# Patient Record
Sex: Male | Born: 1990
Health system: Southern US, Community
[De-identification: ages and names within clinical notes are randomized; demographics above are authoritative.]

## PROBLEM LIST (undated history)

## (undated) DIAGNOSIS — G35 Multiple sclerosis: Secondary | ICD-10-CM

## (undated) DIAGNOSIS — F32A Depression, unspecified: Secondary | ICD-10-CM

## (undated) DIAGNOSIS — R51 Headache: Secondary | ICD-10-CM

## (undated) DIAGNOSIS — F329 Major depressive disorder, single episode, unspecified: Secondary | ICD-10-CM

## (undated) DIAGNOSIS — R519 Headache, unspecified: Secondary | ICD-10-CM

## (undated) DIAGNOSIS — F419 Anxiety disorder, unspecified: Secondary | ICD-10-CM

## (undated) HISTORY — DX: Major depressive disorder, single episode, unspecified: F32.9

## (undated) HISTORY — DX: Headache: R51

## (undated) HISTORY — DX: Headache, unspecified: R51.9

## (undated) HISTORY — PX: TOTAL HIP ARTHROPLASTY: SHX124

## (undated) HISTORY — DX: Depression, unspecified: F32.A

## (undated) HISTORY — DX: Anxiety disorder, unspecified: F41.9

---

## 2011-01-30 ENCOUNTER — Ambulatory Visit: Payer: Self-pay | Admitting: Gastroenterology

## 2011-03-18 ENCOUNTER — Ambulatory Visit (INDEPENDENT_AMBULATORY_CARE_PROVIDER_SITE_OTHER): Payer: BC Managed Care – PPO | Admitting: Gastroenterology

## 2011-03-18 ENCOUNTER — Encounter: Payer: Self-pay | Admitting: Gastroenterology

## 2011-03-18 VITALS — BP 128/62 | HR 88 | Ht 69.0 in | Wt 250.0 lb

## 2011-03-18 DIAGNOSIS — K921 Melena: Secondary | ICD-10-CM

## 2011-03-18 DIAGNOSIS — R1032 Left lower quadrant pain: Secondary | ICD-10-CM

## 2011-03-18 MED ORDER — PEG-KCL-NACL-NASULF-NA ASC-C 100 G PO SOLR: 1.0000 | Freq: Once | ORAL | Status: DC

## 2011-03-18 NOTE — Patient Instructions (Signed)
You have been scheduled for a Colonoscopy. Separate instructions given. Your prep kit has been sent to your pharmacy.  cc: Rudi Heap, MD

## 2011-03-18 NOTE — Progress Notes (Signed)
History of Present Illness: This is a 20 year old male here today with his wife for further evaluation of persistent rectal bleeding. He states for the past 2 years he is generally has small amount of rectal bleeding a daily basis. He states he has a daily bowel movement and then a few hours later he feels the urge to have another bowel movement and notes small amounts of blood at that time. He occasionally has mild anal and rectal burning and itching. He notes intermittent mild left lower quadrant pain seemed to improve with bowel movements. He denies constipation, diarrhea, change in stool caliber, change in bowel habits, weight loss, nausea, vomiting, reflux symptoms. Both he his wife feels that his symptoms tend to be worse when he is under physical and/or emotional stress. Hemoccult cards were obtained on one occasion in May and were negative. CBC and a chemistry panel from May were normal.  Past Medical History  Diagnosis Date  . Hemorrhoids   . Generalized headaches    Past Surgical History  Procedure Date  . Total hip arthroplasty     Left     reports that he has never smoked. He uses smokeless tobacco. He reports that he does not drink alcohol or use illicit drugs. family history includes Coronary artery disease in his maternal grandfather and maternal grandmother.  There is no history of Colon cancer. No Known Allergies    Outpatient Encounter Prescriptions as of 03/18/2011  Medication Sig Dispense Refill  . peg 3350 powder (MOVIPREP) 100 G SOLR Take 1 kit (100 g total) by mouth once.  1 kit  0    Review of Systems: Pertinent positive and negative review of systems were noted in the above HPI section. All other review of systems were otherwise negative.  Physical Exam: General: Well developed , well nourished, no acute distress Head: Normocephalic and atraumatic Eyes:  sclerae anicteric, EOMI Ears: Normal auditory acuity Mouth: No deformity or lesions Neck: Supple, no masses or  thyromegaly Lungs: Clear throughout to auscultation Heart: Regular rate and rhythm; no murmurs, rubs or bruits Abdomen: Soft, non tender and non distended. No masses, hepatosplenomegaly or hernias noted. Normal Bowel sounds Rectal: No internal or external lesions, no tenderness, Hemoccult negative soft brown stool in the vault.  Musculoskeletal: Symmetrical with no gross deformities  Skin: No lesions on visible extremities Pulses:  Normal pulses noted Extremities: No clubbing, cyanosis, edema or deformities noted Neurological: Alert oriented x 4, grossly nonfocal Cervical Nodes:  No significant cervical adenopathy Inguinal Nodes: No significant inguinal adenopathy Psychological:  Alert and cooperative. Normal mood and affect  Assessment and Recommendations:  1. Persistent hematochezia with occasional left lower quadrant abdominal pain. Rule out hemorrhoidal bleeding, proctitis and other disorders. Interestingly his Hemoccults were negative today and were negative in May. Begin over-the-counter Preparation H suppositories on a daily basis until the colonoscopy is completed. The risks, benefits, and alternatives to colonoscopy with possible biopsy and possible polypectomy were discussed with the patient and they consent to proceed.

## 2011-03-19 ENCOUNTER — Encounter: Payer: Self-pay | Admitting: Gastroenterology

## 2011-03-21 ENCOUNTER — Encounter: Payer: Self-pay | Admitting: Gastroenterology

## 2011-03-21 ENCOUNTER — Telehealth: Payer: Self-pay | Admitting: Gastroenterology

## 2011-03-21 NOTE — Telephone Encounter (Signed)
Yes. Please charge him. We have alternative prep that are less expensive. Sheri or Marchelle Folks can provide.

## 2011-03-21 NOTE — Telephone Encounter (Signed)
Error

## 2011-03-22 ENCOUNTER — Other Ambulatory Visit: Payer: BC Managed Care – PPO | Admitting: Gastroenterology

## 2011-04-04 ENCOUNTER — Ambulatory Visit (AMBULATORY_SURGERY_CENTER): Payer: BC Managed Care – PPO | Admitting: Gastroenterology

## 2011-04-04 ENCOUNTER — Encounter: Payer: Self-pay | Admitting: Gastroenterology

## 2011-04-04 DIAGNOSIS — K921 Melena: Secondary | ICD-10-CM

## 2011-04-04 DIAGNOSIS — K625 Hemorrhage of anus and rectum: Secondary | ICD-10-CM

## 2011-04-04 DIAGNOSIS — R1032 Left lower quadrant pain: Secondary | ICD-10-CM

## 2011-04-04 MED ORDER — SODIUM CHLORIDE 0.9 % IV SOLN: 500.0000 mL | INTRAVENOUS | Status: DC

## 2011-04-04 MED ORDER — MESALAMINE 1000 MG RE SUPP: 1000.0000 mg | Freq: Every day | RECTAL | Status: DC

## 2011-04-04 NOTE — Patient Instructions (Signed)
FOLLOW DISCHARGE INSTRUCTIONS (BLUE & GREEN SHEETS)   [facility] SUPPOSIT0RY 1000MG  PER RECTUM DAILY SENT IN TO YOUR PHARMACY FOR YOU TO PICK UP.   CALL DR. STARK'S OFFICE TO MAKE APPOINTMENT FOR 4-6 WEEKS

## 2011-04-05 ENCOUNTER — Telehealth: Payer: Self-pay | Admitting: *Deleted

## 2011-04-05 NOTE — Telephone Encounter (Signed)
No identifier, no message left. Chart given to N. Hill for further follow up./TE

## 2011-04-15 ENCOUNTER — Encounter: Payer: Self-pay | Admitting: Gastroenterology

## 2013-04-19 ENCOUNTER — Telehealth: Payer: Self-pay | Admitting: Family Medicine

## 2013-04-19 NOTE — Telephone Encounter (Signed)
Great toe redness, swelling, and pain for several months.   He has been trying to work on it at home. Appt scheduled for tomorrow. Patient aware.

## 2013-04-20 ENCOUNTER — Encounter: Payer: Self-pay | Admitting: Family Medicine

## 2013-04-20 ENCOUNTER — Ambulatory Visit (INDEPENDENT_AMBULATORY_CARE_PROVIDER_SITE_OTHER): Payer: BC Managed Care – PPO | Admitting: Family Medicine

## 2013-04-20 VITALS — BP 109/70 | HR 58 | Temp 97.2°F | Ht 69.0 in | Wt 234.0 lb

## 2013-04-20 DIAGNOSIS — L600 Ingrowing nail: Secondary | ICD-10-CM

## 2013-04-20 MED ORDER — CEPHALEXIN 500 MG PO CAPS: 500.0000 mg | ORAL_CAPSULE | Freq: Three times a day (TID) | ORAL | Status: DC

## 2013-04-20 NOTE — Progress Notes (Signed)
  Subjective:    Patient ID: Isaac Scott, male    DOB: 11-15-1990, 22 y.o.   MRN: [id number]  HPI Patient complains of left ingrown toenail for a couple of months. He thought it was getting better for a while and then it has gotten worse again. The patient's wife is with him today.    Review of Systems  Constitutional: Negative.   HENT: Negative.   Eyes: Negative.   Respiratory: Negative.   Cardiovascular: Negative.   Gastrointestinal: Negative.   Endocrine: Negative.   Genitourinary: Negative.   Musculoskeletal: Negative.   Skin: Positive for wound (left great toe - ingrown).  Allergic/Immunologic: Negative.   Neurological: Negative.   Hematological: Negative.   Psychiatric/Behavioral: Negative.        Objective:   Physical Exam Ingrown toenail of left great toe both edges. No signs of any proximal cellulitis other than the nail edges.       Assessment & Plan:  1. Ingrown left greater toenail -Keflex 500 mg 3 times daily for two-week -Return to clinic one week excised both nail edges -If worse come back sooner  Patient Instructions  Soak foot in warm salty water 3-4 times daily for 20 minutes Clean with peroxide after soaking Elevate to reduce pain Take antibiotic as directed   Nyra Capes MD

## 2013-04-20 NOTE — Patient Instructions (Signed)
Soak foot in warm salty water 3-4 times daily for 20 minutes Clean with peroxide after soaking Elevate to reduce pain Take antibiotic as directed

## 2013-04-27 ENCOUNTER — Encounter: Payer: Self-pay | Admitting: *Deleted

## 2013-04-27 ENCOUNTER — Encounter: Payer: Self-pay | Admitting: Family Medicine

## 2013-04-27 ENCOUNTER — Ambulatory Visit (INDEPENDENT_AMBULATORY_CARE_PROVIDER_SITE_OTHER): Payer: BC Managed Care – PPO | Admitting: Family Medicine

## 2013-04-27 VITALS — BP 122/75 | HR 71 | Temp 98.6°F | Ht 69.0 in | Wt 237.0 lb

## 2013-04-27 DIAGNOSIS — L600 Ingrowing nail: Secondary | ICD-10-CM

## 2013-04-27 DIAGNOSIS — Z23 Encounter for immunization: Secondary | ICD-10-CM

## 2013-04-27 NOTE — Progress Notes (Signed)
  Subjective:    Patient ID: Isaac Scott, male    DOB: 1990-10-30, 22 y.o.   MRN: [id number]  HPI Patient is here for recheck of ingrown toenail. The patient has been on antibiotics for a week and his toe actually looks better, but the edges are still ingrown. Both edges of the left great toe were ingrown . He is ready for excision of both nail edges.  There are no active problems to display for this patient.  Outpatient Encounter Prescriptions as of 04/27/2013  Medication Sig Dispense Refill  . cephALEXin (KEFLEX) 500 MG capsule Take 1 capsule (500 mg total) by mouth 3 (three) times daily.  42 capsule  0   No facility-administered encounter medications on file as of 04/27/2013.       Review of Systems  Constitutional: Negative.   HENT: Negative.   Eyes: Negative.   Respiratory: Negative.   Cardiovascular: Negative.   Gastrointestinal: Negative.   Endocrine: Negative.   Genitourinary: Negative.   Musculoskeletal: Negative.        Ingrown - left great toe  Skin: Negative.   Allergic/Immunologic: Negative.   Neurological: Negative.   Hematological: Negative.   Psychiatric/Behavioral: Negative.        Objective:   Physical Exam  BP 122/75  Pulse 71  Temp(Src) 98.6 F (37 C) (Oral)  Ht 5\' 9"  (1.753 m)  Wt 237 lb (107.502 kg)  BMI 34.98 kg/m2 The procedure was explained to the patient and after sterile preparation and Xylocaine both nail edges of the left great toenail were excised without complication. A pressure dressing was applied with antibiotic ointment. Patient tolerated the procedure well and he will return to clinic in a couple days to remove the dressing.     Assessment & Plan:   1. Ingrown left big toenail, both edges   2. Need for Tdap vaccination    Patient Instructions  Continue antibiotic Elevate when possible Return to clinic in a couple of days and we will change the dressing   Nyra Capes MD

## 2013-04-27 NOTE — Patient Instructions (Signed)
Continue antibiotic Elevate when possible Return to clinic in a couple of days and we will change the dressing

## 2013-04-29 ENCOUNTER — Ambulatory Visit (INDEPENDENT_AMBULATORY_CARE_PROVIDER_SITE_OTHER): Payer: Self-pay | Admitting: Family Medicine

## 2013-04-29 ENCOUNTER — Encounter: Payer: Self-pay | Admitting: Family Medicine

## 2013-04-29 VITALS — BP 115/70 | HR 67 | Temp 97.8°F | Ht 69.0 in | Wt 237.0 lb

## 2013-04-29 DIAGNOSIS — L03032 Cellulitis of left toe: Secondary | ICD-10-CM

## 2013-04-29 DIAGNOSIS — L02619 Cutaneous abscess of unspecified foot: Secondary | ICD-10-CM

## 2013-04-29 DIAGNOSIS — L600 Ingrowing nail: Secondary | ICD-10-CM

## 2013-04-29 NOTE — Patient Instructions (Addendum)
Continue current medications. Continue good therapeutic lifestyle changes. Schedule your flu vaccine the first of October. Follow up as planned and earlier as needed.  Let toe be open to the air at night time and when  At home Apply Betadine wet to dry dressing every morning Return to clinic in one week for recheck

## 2013-04-29 NOTE — Progress Notes (Signed)
  Subjective:    Patient ID: Isaac Scott, male    DOB: 02/03/1991, 22 y.o.   MRN: [id number]  HPI pt here today for rck of toe from recent ingrown toenail.  Outpatient Encounter Prescriptions as of 04/29/2013  Medication Sig Dispense Refill  . cephALEXin (KEFLEX) 500 MG capsule Take 1 capsule (500 mg total) by mouth 3 (three) times daily.  42 capsule  0   No facility-administered encounter medications on file as of 04/29/2013.       Review of Systems  Constitutional: Negative.   HENT: Negative.   Eyes: Negative.   Respiratory: Negative.   Cardiovascular: Negative.   Gastrointestinal: Negative.   Endocrine: Negative.   Genitourinary: Negative.   Musculoskeletal: Negative.   Skin: Positive for wound.  Allergic/Immunologic: Negative.   Neurological: Negative.   Hematological: Negative.   Psychiatric/Behavioral: Negative.        Objective:   Physical Exam BP 115/70  Pulse 67  Temp(Src) 97.8 F (36.6 C) (Oral)  Ht 5\' 9"  (1.753 m)  Wt 237 lb (107.502 kg)  BMI 34.98 kg/m2 Pts left great toe is healing well. Minimal redness, minimal swelling, minimal pain. Doing well as expected.        Assessment & Plan:   1. Ingrown left big toenail   2. Cellulitis of great toe, left    Patient Instructions  Continue current medications. Continue good therapeutic lifestyle changes. Schedule your flu vaccine the first of October. Follow up as planned and earlier as needed.  Let toe be open to the air at night time and when  At home Apply Betadine wet to dry dressing every morning Return to clinic in one week for recheck     Nyra Capes MD

## 2013-05-06 ENCOUNTER — Ambulatory Visit (INDEPENDENT_AMBULATORY_CARE_PROVIDER_SITE_OTHER): Payer: Self-pay | Admitting: Family Medicine

## 2013-05-06 ENCOUNTER — Encounter: Payer: Self-pay | Admitting: Family Medicine

## 2013-05-06 VITALS — BP 110/80 | HR 76 | Temp 97.0°F | Ht 69.0 in | Wt 237.0 lb

## 2013-05-06 DIAGNOSIS — L600 Ingrowing nail: Secondary | ICD-10-CM

## 2013-05-06 NOTE — Progress Notes (Signed)
Pt here today for quick check of ingrown toenail. Area looks better and is cleared. Pt will not need another follow up on this problem. Follow up prn.

## 2013-05-26 ENCOUNTER — Ambulatory Visit: Payer: BC Managed Care – PPO | Admitting: Family Medicine

## 2014-12-19 ENCOUNTER — Telehealth: Payer: Self-pay | Admitting: Family Medicine

## 2014-12-19 NOTE — Telephone Encounter (Signed)
Appointment scheduled for 3:30 on 12/21/14 with Dr Christell Constant. Patient aware.

## 2014-12-21 ENCOUNTER — Encounter: Payer: Self-pay | Admitting: *Deleted

## 2014-12-21 ENCOUNTER — Encounter: Payer: Self-pay | Admitting: Family Medicine

## 2014-12-21 ENCOUNTER — Ambulatory Visit (INDEPENDENT_AMBULATORY_CARE_PROVIDER_SITE_OTHER): Payer: 59 | Admitting: Family Medicine

## 2014-12-21 VITALS — BP 122/88 | HR 66 | Temp 97.1°F | Ht 69.0 in | Wt 255.0 lb

## 2014-12-21 DIAGNOSIS — L600 Ingrowing nail: Secondary | ICD-10-CM

## 2014-12-21 MED ORDER — CEPHALEXIN 500 MG PO CAPS: 500.0000 mg | ORAL_CAPSULE | Freq: Four times a day (QID) | ORAL | Status: DC

## 2014-12-21 NOTE — Patient Instructions (Signed)
Soak the foot in warm salt water 20 minutes 3 or 4 times daily. After soaking cleansed with Betadine solution and dress with gauze and kling Elevate foot as much as possible Take extra strength Tylenol if needed for pain Take antibiotic as directed

## 2014-12-21 NOTE — Progress Notes (Signed)
   Subjective:    Patient ID: Isaac Scott, male    DOB: 05/17/1991, 24 y.o.   MRN: [id number]  HPI Patient here today for an ingrown toenail of left foot. This is been going on for several weeks and maybe months. He is not been soaking it or doing anything in particular to help keep it clean.      There are no active problems to display for this patient.  No outpatient encounter prescriptions on file as of 12/21/2014.   No facility-administered encounter medications on file as of 12/21/2014.      Review of Systems  Constitutional: Negative.   HENT: Negative.   Eyes: Negative.   Respiratory: Negative.   Cardiovascular: Negative.   Gastrointestinal: Negative.   Endocrine: Negative.   Genitourinary: Negative.   Musculoskeletal: Negative.   Skin: Negative.        Left foot ingrown toenail  Allergic/Immunologic: Negative.   Neurological: Negative.   Hematological: Negative.   Psychiatric/Behavioral: Negative.        Objective:   Physical Exam  Constitutional: He is oriented to person, place, and time. He appears well-developed and well-nourished.  Musculoskeletal: He exhibits edema and tenderness.  Severely inflamed medial aspect  of the left great toenail which also appears to have some fungal involvement.  Neurological: He is alert and oriented to person, place, and time.  Skin: Skin is warm. There is erythema.  Inflamed left great toenail that is ingrown  Psychiatric: He has a normal mood and affect. His behavior is normal. Judgment and thought content normal.   BP 122/88 mmHg  Pulse 66  Temp(Src) 97.1 F (36.2 C) (Oral)  Ht 5\' 9"  (1.753 m)  Wt 255 lb (115.667 kg)  BMI 37.64 kg/m2        Assessment & Plan:  1. Ingrown toenail -This is severely inflamed and the patient will be prescribed antibiotic prior to removal of the toenail or part of it. -The patient will return in a couple days after soaking the foot frequently in warm salty water and applying  Betadine solution -At that time we will remove the edge of the nail or the entire toenail if necessary.  Keflex 500 mg 4 times daily for 10 days   Patient Instructions  Soak the foot in warm salt water 20 minutes 3 or 4 times daily. After soaking cleansed with Betadine solution and dress with gauze and kling Elevate foot as much as possible Take extra strength Tylenol if needed for pain Take antibiotic as directed    Nyra Capes MD

## 2014-12-23 ENCOUNTER — Encounter: Payer: Self-pay | Admitting: Nurse Practitioner

## 2014-12-23 ENCOUNTER — Ambulatory Visit (INDEPENDENT_AMBULATORY_CARE_PROVIDER_SITE_OTHER): Payer: 59 | Admitting: Nurse Practitioner

## 2014-12-23 VITALS — BP 122/82 | HR 77 | Temp 97.0°F | Ht 69.0 in | Wt 256.0 lb

## 2014-12-23 DIAGNOSIS — L600 Ingrowing nail: Secondary | ICD-10-CM | POA: Diagnosis not present

## 2014-12-23 NOTE — Patient Instructions (Signed)
Ingrown Toenail An ingrown toenail occurs when the sharp edge of your toenail grows into the skin. Causes of ingrown toenails include toenails clipped too far back or poorly fitting shoes. Activities involving sudden stops (basketball, tennis) causing "toe jamming" may lead to an ingrown nail. HOME CARE INSTRUCTIONS   Soak the whole foot in warm soapy water for 20 minutes, 3 times per day.  You may lift the edge of the nail away from the sore skin by wedging a small piece of cotton under the corner of the nail. Be careful not to dig (traumatize) and cause more injury to the area.  Wear shoes that fit well. While the ingrown nail is causing problems, sandals may be beneficial.  Trim your toenails regularly and carefully. Cut your toenails straight across, not in a curve. This will prevent injury to the skin at the corners of the toenail.  Keep your feet clean and dry.  Crutches may be helpful early in treatment if walking is painful.  Antibiotics, if prescribed, should be taken as directed.  Return for a wound check in 2 days or as directed.  Only take over-the-counter or prescription medicines for pain, discomfort, or fever as directed by your caregiver. SEEK IMMEDIATE MEDICAL CARE IF:   You have a fever.  You have increasing pain, redness, swelling, or heat at the wound site.  Your toe is not better in 7 days. If conservative treatment is not successful, surgical removal of a portion or all of the nail may be necessary. MAKE SURE YOU:   Understand these instructions.  Will watch your condition.  Will get help right away if you are not doing well or get worse. Document Released: 07/19/2000 Document Revised: 10/14/2011 Document Reviewed: 07/13/2008 [employer] Patient Information 2015 [employer]. This information is not intended to replace advice given to you by your health care provider. Make sure you discuss any questions you have with your health care provider.  

## 2014-12-23 NOTE — Progress Notes (Signed)
   Subjective:    Patient ID: Isaac Scott, male    DOB: [birth year], 24 y.o.   MRN: [id number]  HPI  Patient saw Dr. Christell Constant with infected ingrown toenail on Wednesday- He was given antibiotic and was to return today to have toenail removed. He has been sosking it in epsom salt and it is a little better.   Review of Systems     Objective:   Physical Exam  Constitutional: He appears well-developed and well-nourished. No distress.  Cardiovascular: Normal rate, regular rhythm and normal heart sounds.   Pulmonary/Chest: Effort normal and breath sounds normal.  Skin:  Erythematous medial nail border left first toe  procedure- lidocaie 1% plain with Marcaine0.25% 1:1- 31ml local  Cleaned with betadine  Medial nail border removed with freer and stevens scissors  Cleaned with NACL  Antibiotic ointment  dsg applied         Assessment & Plan:   1. Ingrown toenail    Keep clean and dry continue antibiotics as rx Epsom salt soaks starting tomorrow RTO prn  Mary-Margaret Daphine Deutscher, FNP

## 2015-12-08 ENCOUNTER — Encounter (INDEPENDENT_AMBULATORY_CARE_PROVIDER_SITE_OTHER): Payer: Self-pay

## 2015-12-08 ENCOUNTER — Ambulatory Visit (INDEPENDENT_AMBULATORY_CARE_PROVIDER_SITE_OTHER): Payer: 59 | Admitting: Family Medicine

## 2015-12-08 ENCOUNTER — Encounter: Payer: Self-pay | Admitting: *Deleted

## 2015-12-08 ENCOUNTER — Encounter: Payer: Self-pay | Admitting: Family Medicine

## 2015-12-08 VITALS — BP 118/79 | HR 71 | Temp 97.6°F | Ht 69.0 in | Wt 267.4 lb

## 2015-12-08 DIAGNOSIS — L600 Ingrowing nail: Secondary | ICD-10-CM

## 2015-12-08 DIAGNOSIS — M5417 Radiculopathy, lumbosacral region: Secondary | ICD-10-CM | POA: Diagnosis not present

## 2015-12-08 DIAGNOSIS — M5416 Radiculopathy, lumbar region: Secondary | ICD-10-CM

## 2015-12-08 MED ORDER — SULFAMETHOXAZOLE-TRIMETHOPRIM 800-160 MG PO TABS: 1.0000 | ORAL_TABLET | Freq: Two times a day (BID) | ORAL | Status: DC

## 2015-12-08 MED ORDER — PREDNISONE 20 MG PO TABS: ORAL_TABLET | ORAL | Status: DC

## 2015-12-08 NOTE — Progress Notes (Signed)
BP 118/79 mmHg  Pulse 71  Temp(Src) 97.6 F (36.4 C) (Oral)  Ht 5\' 9"  (1.753 m)  Wt 267 lb 6.4 oz (121.292 kg)  BMI 39.47 kg/m2   Subjective:    Patient ID: Isaac Scott, male    DOB: 04-17-1991, 25 y.o.   MRN: [id number]  HPI: Isaac Scott is a 25 y.o. male presenting on 12/08/2015 for Back Pain   HPI Back pain Patient has left sided lumbar back pain with tingling and numbness going down the inside of his left leg all the way to his toes. He has chronic issues with low back pain but just 2 days ago he started having the tingling and numbness going down the inside of his left leg. He has had this once before a few years ago and then it improved but now to start coming back. He denies any weakness in that leg. He denies any fevers or chills or overlying skin changes in his back. He is able to ambulate normally.  Ingrown toenail left big toe Patient has an ingrown toenail on his left big toe that has been there chronically over a year. He has been developing some calluses in that toe as well. He denies any fevers or chills but there is a lot of tenderness associated with it. After he had it removed a year ago A came back quickly. He is changed his shoes and tried to treat athlete's foot but is still having issues with her. He had issues with his other big toe 2 but is much improved.  Relevant past medical, surgical, family and social history reviewed and updated as indicated. Interim medical history since our last visit reviewed. Allergies and medications reviewed and updated.  Review of Systems  Constitutional: Negative for fever.  HENT: Negative for ear discharge and ear pain.   Eyes: Negative for discharge and visual disturbance.  Respiratory: Negative for shortness of breath and wheezing.   Cardiovascular: Negative for chest pain and leg swelling.  Gastrointestinal: Negative for abdominal pain, diarrhea and constipation.  Genitourinary: Negative for difficulty urinating.    Musculoskeletal: Positive for back pain. Negative for gait problem.  Skin: Positive for color change. Negative for rash.  Neurological: Positive for numbness. Negative for syncope, weakness, light-headedness and headaches.  All other systems reviewed and are negative.   Per HPI unless specifically indicated above     Medication List       This list is accurate as of: 12/08/15  2:15 PM.  Always use your most recent med list.               acetaminophen 500 MG tablet  Commonly known as:  TYLENOL  Take 500 mg by mouth every 6 (six) hours as needed.     predniSONE 20 MG tablet  Commonly known as:  DELTASONE  2 po at same time daily for 5 days     sulfamethoxazole-trimethoprim 800-160 MG tablet  Commonly known as:  BACTRIM DS,SEPTRA DS  Take 1 tablet by mouth 2 (two) times daily.           Objective:    BP 118/79 mmHg  Pulse 71  Temp(Src) 97.6 F (36.4 C) (Oral)  Ht 5\' 9"  (1.753 m)  Wt 267 lb 6.4 oz (121.292 kg)  BMI 39.47 kg/m2  Wt Readings from Last 3 Encounters:  12/08/15 267 lb 6.4 oz (121.292 kg)  12/23/14 256 lb (116.121 kg)  12/21/14 255 lb (115.667 kg)    Physical Exam  Constitutional:  He is oriented to person, place, and time. He appears well-developed and well-nourished. No distress.  Eyes: Conjunctivae and EOM are normal. Pupils are equal, round, and reactive to light. Right eye exhibits no discharge. No scleral icterus.  Neck: Neck supple. No thyromegaly present.  Cardiovascular: Normal rate, regular rhythm, normal heart sounds and intact distal pulses.   No murmur heard. Pulmonary/Chest: Effort normal and breath sounds normal. No respiratory distress. He has no wheezes.  Musculoskeletal: Normal range of motion. He exhibits tenderness. He exhibits no edema.       Back:  Lymphadenopathy:    He has no cervical adenopathy.  Neurological: He is alert and oriented to person, place, and time. Coordination normal.  Skin: Skin is warm and dry. No rash  noted. He is not diaphoretic.  Psychiatric: He has a normal mood and affect. His behavior is normal.  Nursing note and vitals reviewed.   No results found for this or any previous visit.    Assessment & Plan:   Problem List Items Addressed This Visit    None    Visit Diagnoses    Ingrown left big toenail    -  Primary    Relevant Medications    sulfamethoxazole-trimethoprim (BACTRIM DS,SEPTRA DS) 800-160 MG tablet    Lumbar back pain with radiculopathy affecting left lower extremity        Relevant Medications    acetaminophen (TYLENOL) 500 MG tablet    predniSONE (DELTASONE) 20 MG tablet    Other Relevant Orders    MR Lumbar Spine Wo Contrast       Follow up plan: Return in about 2 weeks (around 12/22/2015), or if symptoms worsen or fail to improve, for Toenail removal.  Counseling provided for all of the vaccine components Orders Placed This Encounter  Procedures  . MR Lumbar Spine Wo Contrast    Arville Care, MD [facility] 12/08/2015, 2:15 PM

## 2015-12-12 ENCOUNTER — Telehealth: Payer: Self-pay | Admitting: Family Medicine

## 2015-12-12 MED ORDER — CYCLOBENZAPRINE HCL 5 MG PO TABS: 5.0000 mg | ORAL_TABLET | Freq: Three times a day (TID) | ORAL | Status: DC | PRN

## 2015-12-12 NOTE — Telephone Encounter (Signed)
I sent in a rx of flexeril. PT needs  Follow up appt.

## 2015-12-12 NOTE — Telephone Encounter (Signed)
Patient of [facility]. Can you review and advise

## 2015-12-12 NOTE — Telephone Encounter (Signed)
Patient aware and appointment scheduled for tomorrow at 8:25am with [facility].

## 2015-12-13 ENCOUNTER — Ambulatory Visit (INDEPENDENT_AMBULATORY_CARE_PROVIDER_SITE_OTHER): Payer: 59 | Admitting: Family Medicine

## 2015-12-13 ENCOUNTER — Encounter: Payer: Self-pay | Admitting: Family Medicine

## 2015-12-13 VITALS — BP 119/79 | HR 72 | Temp 97.5°F | Ht 69.0 in | Wt 269.8 lb

## 2015-12-13 DIAGNOSIS — M5416 Radiculopathy, lumbar region: Secondary | ICD-10-CM

## 2015-12-13 DIAGNOSIS — M5417 Radiculopathy, lumbosacral region: Secondary | ICD-10-CM | POA: Diagnosis not present

## 2015-12-13 MED ORDER — PREDNISONE 20 MG PO TABS: ORAL_TABLET | ORAL | Status: DC

## 2015-12-13 NOTE — Progress Notes (Signed)
BP 119/79 mmHg  Pulse 72  Temp(Src) 97.5 F (36.4 C) (Oral)  Ht  (1.753 m)  Wt 269 lb 12.8 oz (122.38 kg)  BMI 39.82 kg/m2   Subjective:    Patient ID: Isaac Scott, male    DOB: [birth year], 25 y.o.   MRN: [id number]  HPI: Isaac Scott is a 25 y.o. male presenting on 12/13/2015 for Back Pain   HPI  Back pain with radiculopathy Lumbar back pain with radiculopathy going down the left lower leg and numbness going all that his toes. Patient had improvement while on the prednisone but not completely resolved. Patient has been having some difficulty walking because of the pain. He is also been using iron profile and doing some stretches that we gave to him last time and using a tennis ball to self massage.  Relevant past medical, surgical, family and social history reviewed and updated as indicated. Interim medical history since our last visit reviewed. Allergies and medications reviewed and updated.  Review of Systems  Constitutional: Negative for fever.  HENT: Negative for ear discharge and ear pain.   Eyes: Negative for discharge and visual disturbance.  Respiratory: Negative for shortness of breath and wheezing.   Cardiovascular: Negative for chest pain and leg swelling.  Gastrointestinal: Negative for abdominal pain, diarrhea and constipation.  Genitourinary: Negative for difficulty urinating.  Musculoskeletal: Negative for back pain and gait problem.  Skin: Positive for color change. Negative for rash.  Neurological: Positive for numbness. Negative for syncope, weakness, light-headedness and headaches.  All other systems reviewed and are negative.   Per HPI unless specifically indicated above     Medication List       This list is accurate as of: 12/13/15  9:21 AM.  Always use your most recent med list.               acetaminophen 500 MG tablet  Commonly known as:  TYLENOL  Take 500 mg by mouth every 6 (six) hours as needed.     cyclobenzaprine 5 MG tablet    Commonly known as:  FLEXERIL  Take 1 tablet (5 mg total) by mouth 3 (three) times daily as needed for muscle spasms.     predniSONE 20 MG tablet  Commonly known as:  DELTASONE  Take 3 tabs daily for 1 week, then 2 tabs daily for week 2, then 1 tab daily for week 3.     sulfamethoxazole-trimethoprim 800-160 MG tablet  Commonly known as:  BACTRIM DS,SEPTRA DS  Take 1 tablet by mouth 2 (two) times daily.           Objective:    BP 119/79 mmHg  Pulse 72  Temp(Src) 97.5 F (36.4 C) (Oral)  Ht  (1.753 m)  Wt 269 lb 12.8 oz (122.38 kg)  BMI 39.82 kg/m2  Wt Readings from Last 3 Encounters:  12/13/15 269 lb 12.8 oz (122.38 kg)  12/08/15 267 lb 6.4 oz (121.292 kg)  12/23/14 256 lb (116.121 kg)    Physical Exam  Constitutional: He is oriented to person, place, and time. He appears well-developed and well-nourished. No distress.  Eyes: Conjunctivae and EOM are normal. Pupils are equal, round, and reactive to light. Right eye exhibits no discharge. No scleral icterus.  Neck: Neck supple. No thyromegaly present.  Cardiovascular: Normal rate, regular rhythm, normal heart sounds and intact distal pulses.   No murmur heard. Pulmonary/Chest: Effort normal and breath sounds normal. No respiratory distress. He has no wheezes.  Musculoskeletal:  Normal range of motion. He exhibits tenderness. He exhibits no edema.       Back:  Lymphadenopathy:    He has no cervical adenopathy.  Neurological: He is alert and oriented to person, place, and time. Coordination normal.  Skin: Skin is warm and dry. No rash noted. He is not diaphoretic.  Psychiatric: He has a normal mood and affect. His behavior is normal.  Nursing note and vitals reviewed.   No results found for this or any previous visit.    Assessment & Plan:   Problem List Items Addressed This Visit    None    Visit Diagnoses    Lumbar back pain with radiculopathy affecting left lower extremity    -  Primary    Relevant  Medications    predniSONE (DELTASONE) 20 MG tablet    Other Relevant Orders    Ambulatory referral to Orthopedic Surgery       Follow up plan: Return if symptoms worsen or fail to improve.  Counseling provided for all of the vaccine components Orders Placed This Encounter  Procedures  . Ambulatory referral to Orthopedic Surgery    Arville Care, MD [facility] 12/13/2015, 9:21 AM

## 2015-12-19 ENCOUNTER — Telehealth: Payer: Self-pay | Admitting: Family Medicine

## 2015-12-19 ENCOUNTER — Ambulatory Visit (HOSPITAL_COMMUNITY)
Admission: RE | Admit: 2015-12-19 | Discharge: 2015-12-19 | Disposition: A | Discharge status: Home / Self Care | Payer: 59 | Source: Ambulatory Visit | Attending: Family Medicine | Admitting: Family Medicine

## 2015-12-19 DIAGNOSIS — M5416 Radiculopathy, lumbar region: Secondary | ICD-10-CM

## 2015-12-19 DIAGNOSIS — M5126 Other intervertebral disc displacement, lumbar region: Secondary | ICD-10-CM | POA: Diagnosis not present

## 2015-12-19 DIAGNOSIS — M5417 Radiculopathy, lumbosacral region: Secondary | ICD-10-CM | POA: Diagnosis not present

## 2015-12-19 NOTE — Telephone Encounter (Signed)
Patient aware to return to work tomorrow and keep follow up appt with Dr. Louanne Skye.

## 2015-12-19 NOTE — Telephone Encounter (Signed)
Patient called stating that his MRI is scheduled for 4:00 today 5/16.  Patient is wanting to know if he is suppose to go back to work tomorrow.  Informed patient that according to last note written patient would return to work tomorrow.  Asked patient if he felt like he could go back to work.  Patient the states "well its some better, whenever I feel pain I take a muscle relaxer."  Patient also states that he was informed by Dr. Louanne Skye that depending on MRI results will determine if he can go back to work.

## 2015-12-19 NOTE — Telephone Encounter (Signed)
Will not have MRI result until tomorrow unless something critical. Pt to return back to work and keep follow up appt with Dr. Louanne Skye

## 2015-12-29 ENCOUNTER — Ambulatory Visit: Payer: 59 | Admitting: Family Medicine

## 2016-01-02 ENCOUNTER — Encounter: Payer: Self-pay | Admitting: Family Medicine

## 2016-01-11 ENCOUNTER — Telehealth: Payer: Self-pay | Admitting: Family Medicine

## 2016-01-12 ENCOUNTER — Telehealth: Payer: Self-pay | Admitting: Family Medicine

## 2016-01-12 ENCOUNTER — Encounter: Payer: Self-pay | Admitting: Family Medicine

## 2016-01-12 ENCOUNTER — Ambulatory Visit (INDEPENDENT_AMBULATORY_CARE_PROVIDER_SITE_OTHER): Payer: 59 | Admitting: Family Medicine

## 2016-01-12 VITALS — BP 129/87 | HR 79 | Temp 97.5°F | Ht 69.0 in | Wt 270.8 lb

## 2016-01-12 DIAGNOSIS — L600 Ingrowing nail: Secondary | ICD-10-CM

## 2016-01-12 NOTE — Telephone Encounter (Signed)
Spoke with pt and he just has left the office and he states that everything has been taken care of.

## 2016-01-12 NOTE — Telephone Encounter (Signed)
She is faxing forms to me now and i will return today

## 2016-01-12 NOTE — Progress Notes (Signed)
BP 129/87 mmHg  Pulse 79  Temp(Src) 97.5 F (36.4 C) (Oral)  Ht 5\' 9"  (1.753 m)  Wt 270 lb 12.8 oz (122.834 kg)  BMI 39.97 kg/m2   Subjective:    Patient ID: Isaac Scott, male    DOB: Feb 12, 1991, 25 y.o.   MRN: [id number]  HPI: Isaac Scott is a 25 y.o. male presenting on 01/12/2016 for Ingrown Toenail removal   HPI Ingrown toenail Patient is coming in today to get his toenail removed. He had tried antibiotic and conservative measures that he had wanted to try but is ready to have it removed today. He denies any fevers or chills. He does have significant redness and warmth and pain around both lateral and medial paronychia of that left great toe. He also has chronic inflammation signs there which have caused some scarring.  Relevant past medical, surgical, family and social history reviewed and updated as indicated. Interim medical history since our last visit reviewed. Allergies and medications reviewed and updated.  Review of Systems  Constitutional: Negative for fever.  HENT: Negative for ear discharge and ear pain.   Eyes: Negative for discharge and visual disturbance.  Respiratory: Negative for shortness of breath and wheezing.   Cardiovascular: Negative for chest pain and leg swelling.  Gastrointestinal: Negative for abdominal pain, diarrhea and constipation.  Genitourinary: Negative for difficulty urinating.  Musculoskeletal: Positive for back pain. Negative for gait problem.  Skin: Positive for color change. Negative for rash.  Neurological: Positive for numbness. Negative for syncope, weakness, light-headedness and headaches.  All other systems reviewed and are negative.   Per HPI unless specifically indicated above     Medication List    Notice  As of 01/12/2016  3:40 PM   You have not been prescribed any medications.         Objective:    BP 129/87 mmHg  Pulse 79  Temp(Src) 97.5 F (36.4 C) (Oral)  Ht 5\' 9"  (1.753 m)  Wt 270 lb 12.8 oz (122.834  kg)  BMI 39.97 kg/m2  Wt Readings from Last 3 Encounters:  01/12/16 270 lb 12.8 oz (122.834 kg)  12/19/15 260 lb (117.935 kg)  12/13/15 269 lb 12.8 oz (122.38 kg)    Physical Exam  Constitutional: He is oriented to person, place, and time. He appears well-developed and well-nourished. No distress.  Eyes: Conjunctivae and EOM are normal. Pupils are equal, round, and reactive to light. Right eye exhibits no discharge. No scleral icterus.  Neck: Neck supple. No thyromegaly present.  Cardiovascular: Normal rate, regular rhythm, normal heart sounds and intact distal pulses.   No murmur heard. Pulmonary/Chest: Effort normal and breath sounds normal. No respiratory distress. He has no wheezes.  Musculoskeletal: Normal range of motion. He exhibits no edema.  Lymphadenopathy:    He has no cervical adenopathy.  Neurological: He is alert and oriented to person, place, and time. Coordination normal.  Skin: Skin is warm and dry. Lesion (Left great toenail both medial and lateral inflamed and erythematous and swollen chronically with scarring because of the chronic inflammation on left big toe) noted. No rash noted. He is not diaphoretic. There is erythema.  Psychiatric: He has a normal mood and affect. His behavior is normal.  Nursing note and vitals reviewed.  Nail removal of Left big toenail: Betadine used for prep. 2% lidocaine with epinephrine was used for digital ring block, 8 mL. Rubber band used for tourniquet to remove blood flow and decreased bleeding. Elevator was used to  lift the nail bed and then forceps used to remove it from the base. Nail removed easily and probe was used to remove any leftover debris from nail bed. Topical antibiotic was used and then it was covered by 4 x 4 and tape told in place. Procedure was tolerated well with minimal bleeding.    Assessment & Plan:   Problem List Items Addressed This Visit    None    Visit Diagnoses    Ingrown left big toenail    -  Primary         Follow up plan: Return if symptoms worsen or fail to improve.  Counseling provided for all of the vaccine components No orders of the defined types were placed in this encounter.    Arville Care, MD [facility] 01/12/2016, 3:40 PM

## 2016-03-14 ENCOUNTER — Ambulatory Visit (INDEPENDENT_AMBULATORY_CARE_PROVIDER_SITE_OTHER): Payer: 59 | Admitting: Family Medicine

## 2016-03-14 ENCOUNTER — Encounter: Payer: Self-pay | Admitting: Family Medicine

## 2016-03-14 VITALS — BP 125/85 | HR 84 | Temp 97.6°F | Ht 69.0 in | Wt 270.8 lb

## 2016-03-14 DIAGNOSIS — K641 Second degree hemorrhoids: Secondary | ICD-10-CM

## 2016-03-14 MED ORDER — NITROGLYCERIN 0.4 % RE OINT: 1.0000 [in_us] | TOPICAL_OINTMENT | Freq: Two times a day (BID) | RECTAL | 0 refills | Status: DC

## 2016-03-14 NOTE — Progress Notes (Signed)
BP 125/85 (BP Location: Right Arm, Patient Position: Sitting, Cuff Size: Large)   Pulse 84   Temp 97.6 F (36.4 C) (Oral)   Ht  (1.753 m)   Wt 270 lb 12.8 oz (122.8 kg)   BMI 39.99 kg/m    Subjective:    Patient ID: Isaac Scott, male    DOB: May 09, 1991, 25 y.o.   MRN: [id number]  HPI: Isaac Scott is a 25 y.o. male presenting on 03/14/2016 for Rectal Bleeding (had a hemorrhoid over the weekend, began bleeding yesterday, flow has decreased today.  patient has noticed blood in stool and on toilet paper for years, had a colonoscopy at [facility] in 2012)   HPI Rectal bleeding and hemorrhoids Patient had a hemorrhoid that burst over the weekend and began bleeding and over the past 2 days has been continuing to do that with the flow has decreased today. He has been fighting hemorrhoids on and off for years. He denies any issues with diarrhea or constipation. He does have frequent bowel movements 2 or 3 times a day but they're not loose. He denies having to strain or being constipated at all. Discussed changing dietary habits and increasing fiber and fluid intake. It does not seem like he gets much fiber in his diet. He denies any abdominal pain or fevers or chills. He denies any lightheadedness or dizziness.  Relevant past medical, surgical, family and social history reviewed and updated as indicated. Interim medical history since our last visit reviewed. Allergies and medications reviewed and updated.  Review of Systems  Constitutional: Negative for fever.  HENT: Negative for ear discharge and ear pain.   Eyes: Negative for discharge and visual disturbance.  Respiratory: Negative for shortness of breath and wheezing.   Cardiovascular: Negative for chest pain and leg swelling.  Gastrointestinal: Positive for anal bleeding and rectal pain. Negative for abdominal distention, abdominal pain, blood in stool, constipation and diarrhea.  Genitourinary: Negative for difficulty urinating.    Musculoskeletal: Negative for back pain and gait problem.  Skin: Negative for rash.  Neurological: Negative for syncope, light-headedness and headaches.  All other systems reviewed and are negative.   Per HPI unless specifically indicated above     Medication List       Accurate as of 03/14/16  5:09 PM. Always use your most recent med list.          Nitroglycerin 0.4 % Oint Place 1 inch rectally 2 (two) times daily.          Objective:    BP 125/85 (BP Location: Right Arm, Patient Position: Sitting, Cuff Size: Large)   Pulse 84   Temp 97.6 F (36.4 C) (Oral)   Ht  (1.753 m)   Wt 270 lb 12.8 oz (122.8 kg)   BMI 39.99 kg/m   Wt Readings from Last 3 Encounters:  03/14/16 270 lb 12.8 oz (122.8 kg)  01/12/16 270 lb 12.8 oz (122.8 kg)  12/19/15 260 lb (117.9 kg)    Physical Exam  Constitutional: He is oriented to person, place, and time. He appears well-developed and well-nourished. No distress.  Eyes: Conjunctivae and EOM are normal. Pupils are equal, round, and reactive to light. Right eye exhibits no discharge. No scleral icterus.  Neck: Neck supple. No thyromegaly present.  Cardiovascular: Normal rate, regular rhythm, normal heart sounds and intact distal pulses.   No murmur heard. Pulmonary/Chest: Effort normal and breath sounds normal. No respiratory distress. He has no wheezes.  Genitourinary: Prostate  normal. Rectal exam shows external hemorrhoid (One has small thrombus. There is a small amount of active bleed) and tenderness. Rectal exam shows no fissure, no mass and anal tone normal.  Musculoskeletal: Normal range of motion. He exhibits no edema.  Lymphadenopathy:    He has no cervical adenopathy.  Neurological: He is alert and oriented to person, place, and time. Coordination normal.  Skin: Skin is warm and dry. No rash noted. He is not diaphoretic.  Psychiatric: He has a normal mood and affect. His behavior is normal.  Nursing note and vitals  reviewed.   No results found for this or any previous visit.    Assessment & Plan:   Problem List Items Addressed This Visit    None    Visit Diagnoses    Second degree hemorrhoids    -  Primary   2 external hemorrhoids, one has a small thrombus in it, patient does not want to have procedure done yet but will schedule it if does not improve   Relevant Medications   Nitroglycerin 0.4 % OINT       Follow up plan: Return if symptoms worsen or fail to improve.  Counseling provided for all of the vaccine components No orders of the defined types were placed in this encounter.   Arville Care, MD [facility] 03/14/2016, 5:09 PM

## 2016-12-13 ENCOUNTER — Encounter: Payer: Self-pay | Admitting: Family Medicine

## 2016-12-13 ENCOUNTER — Ambulatory Visit (INDEPENDENT_AMBULATORY_CARE_PROVIDER_SITE_OTHER): Payer: 59 | Admitting: Family Medicine

## 2016-12-13 DIAGNOSIS — F419 Anxiety disorder, unspecified: Secondary | ICD-10-CM | POA: Diagnosis not present

## 2016-12-13 DIAGNOSIS — F329 Major depressive disorder, single episode, unspecified: Secondary | ICD-10-CM | POA: Diagnosis not present

## 2016-12-13 DIAGNOSIS — F339 Major depressive disorder, recurrent, unspecified: Secondary | ICD-10-CM | POA: Insufficient documentation

## 2016-12-13 MED ORDER — ESCITALOPRAM OXALATE 10 MG PO TABS: 10.0000 mg | ORAL_TABLET | Freq: Every day | ORAL | 5 refills | Status: DC

## 2016-12-13 NOTE — Progress Notes (Signed)
BP 119/80   Pulse 79   Temp 98 F (36.7 C) (Oral)   Ht 5\' 9"  (1.753 m)   Wt 263 lb (119.3 kg)   BMI 38.84 kg/m    Subjective:    Patient ID: Isaac Scott, male    DOB: 04-Nov-1990, 26 y.o.   MRN: [id number]  HPI: Isaac Scott is a 26 y.o. male presenting on 12/13/2016 for Depression (x 2 weeks, does not feel happy anymore, mood swings; feels that issues from his past are bothering him - marital issues, interested in marriage counseling)   HPI Anxiety and depression and marital issues Patient is coming in to discuss anxiety and depression today. He has been having a lot of issues with his marriage. A lot of his issues stem from when his wife previously cheated on him and left him and they were starting to go through a divorce and then before going through with that they got back together and had been doing well for some time but now he feels like things are starting to crop up again and he has been very anxious and very depressed and just does not feel happy and does not feel like himself. He feels like he is having mood swings all the time and some of those past issues are arising. He would like to go to some form of counseling and/or marriage counseling for this. Depression screen [facility] 2/9 12/13/2016 03/14/2016 01/12/2016 12/13/2015 12/08/2015  Decreased Interest 1 0 0 0 0  Down, Depressed, Hopeless 3 0 0 0 0  PHQ - 2 Score 4 0 0 0 0  Altered sleeping 1 - - - -  Tired, decreased energy 2 - - - -  Change in appetite 3 - - - -  Feeling bad or failure about yourself  3 - - - -  Trouble concentrating 2 - - - -  Moving slowly or fidgety/restless 0 - - - -  Suicidal thoughts 1 - - - -  PHQ-9 Score 16 - - - -  Difficult doing work/chores Very difficult - - - -      Relevant past medical, surgical, family and social history reviewed and updated as indicated. Interim medical history since our last visit reviewed. Allergies and medications reviewed and updated.  Review of Systems    Constitutional: Negative for chills and fever.  Respiratory: Negative for shortness of breath and wheezing.   Cardiovascular: Negative for chest pain and leg swelling.  Musculoskeletal: Negative for back pain and gait problem.  Skin: Negative for rash.  Psychiatric/Behavioral: Positive for dysphoric mood and sleep disturbance. Negative for decreased concentration, self-injury and suicidal ideas. The patient is nervous/anxious.   All other systems reviewed and are negative.   Per HPI unless specifically indicated above        Objective:    BP 119/80   Pulse 79   Temp 98 F (36.7 C) (Oral)   Ht 5\' 9"  (1.753 m)   Wt 263 lb (119.3 kg)   BMI 38.84 kg/m   Wt Readings from Last 3 Encounters:  12/13/16 263 lb (119.3 kg)  03/14/16 270 lb 12.8 oz (122.8 kg)  01/12/16 270 lb 12.8 oz (122.8 kg)    Physical Exam  Constitutional: He appears well-developed and well-nourished. No distress.  Eyes: Conjunctivae are normal. No scleral icterus.  Cardiovascular: Normal rate, regular rhythm, normal heart sounds and intact distal pulses.   No murmur heard. Pulmonary/Chest: Effort normal and breath sounds normal. No respiratory distress.  He has no wheezes.  Musculoskeletal: Normal range of motion.  Neurological: He is alert. Coordination normal.  Skin: Skin is warm and dry. No rash noted. He is not diaphoretic.  Psychiatric: His behavior is normal. Judgment normal. His mood appears anxious. He exhibits a depressed mood. He expresses no suicidal ideation. He expresses no suicidal plans.  Nursing note and vitals reviewed.       Assessment & Plan:   Problem List Items Addressed This Visit      Other   Anxiety and depression   Relevant Medications   escitalopram (LEXAPRO) 10 MG tablet     Has been having issues with wife in marriage will like to go to marriage counseling and individual counseling, we will try on Lexapro and have come back in 4 weeks.  Follow up plan: Return in about 4  weeks (around 01/10/2017), or if symptoms worsen or fail to improve, for Anxiety and depression recheck.  Counseling provided for all of the vaccine components No orders of the defined types were placed in this encounter.   Arville Care, MD [facility] 12/13/2016, 4:41 PM

## 2016-12-14 LAB — CBC WITH DIFFERENTIAL/PLATELET
Basophils Absolute: 0 10*3/uL (ref 0.0–0.2)
Basos: 0 %
EOS (ABSOLUTE): 0.2 10*3/uL (ref 0.0–0.4)
Eos: 2 %
Hematocrit: 47 % (ref 37.5–51.0)
Hemoglobin: 16.3 g/dL (ref 13.0–17.7)
Immature Grans (Abs): 0 10*3/uL (ref 0.0–0.1)
Immature Granulocytes: 0 %
Lymphocytes Absolute: 2.3 10*3/uL (ref 0.7–3.1)
Lymphs: 25 %
MCH: 30.4 pg (ref 26.6–33.0)
MCHC: 34.7 g/dL (ref 31.5–35.7)
MCV: 88 fL (ref 79–97)
Monocytes Absolute: 0.6 10*3/uL (ref 0.1–0.9)
Monocytes: 6 %
Neutrophils Absolute: 6 10*3/uL (ref 1.4–7.0)
Neutrophils: 67 %
Platelets: 193 10*3/uL (ref 150–379)
RBC: 5.36 x10E6/uL (ref 4.14–5.80)
RDW: 13.8 % (ref 12.3–15.4)
WBC: 9.1 10*3/uL (ref 3.4–10.8)

## 2016-12-14 LAB — TSH: TSH: 0.828 u[IU]/mL (ref 0.450–4.500)

## 2017-01-10 ENCOUNTER — Ambulatory Visit: Payer: 59 | Admitting: Family Medicine

## 2017-01-13 ENCOUNTER — Encounter: Payer: Self-pay | Admitting: Family Medicine

## 2017-01-17 ENCOUNTER — Encounter: Payer: Self-pay | Admitting: Family Medicine

## 2017-01-17 ENCOUNTER — Ambulatory Visit (INDEPENDENT_AMBULATORY_CARE_PROVIDER_SITE_OTHER): Payer: 59 | Admitting: Family Medicine

## 2017-01-17 VITALS — BP 127/81 | HR 78 | Temp 98.7°F | Ht 69.0 in | Wt 260.0 lb

## 2017-01-17 DIAGNOSIS — F419 Anxiety disorder, unspecified: Secondary | ICD-10-CM

## 2017-01-17 DIAGNOSIS — F329 Major depressive disorder, single episode, unspecified: Secondary | ICD-10-CM | POA: Diagnosis not present

## 2017-01-17 MED ORDER — HYDROXYZINE HCL 25 MG PO TABS: 25.0000 mg | ORAL_TABLET | Freq: Three times a day (TID) | ORAL | 0 refills | Status: DC | PRN

## 2017-01-17 MED ORDER — VENLAFAXINE HCL ER 75 MG PO CP24: 75.0000 mg | ORAL_CAPSULE | Freq: Every day | ORAL | 1 refills | Status: DC

## 2017-01-17 MED ORDER — VENLAFAXINE HCL ER 37.5 MG PO CP24: 37.5000 mg | ORAL_CAPSULE | Freq: Every day | ORAL | 0 refills | Status: DC

## 2017-01-17 NOTE — Progress Notes (Signed)
BP 127/81   Pulse 78   Temp 98.7 F (37.1 C) (Oral)   Ht 5\' 9"  (1.753 m)   Wt 260 lb (117.9 kg)   BMI 38.40 kg/m    Subjective:    Patient ID: Isaac Scott, male    DOB: 1990-08-11, 26 y.o.   MRN: [id number]  HPI: Isaac Scott is a 26 y.o. male presenting on 01/17/2017 for Anxiety/depression (1 mo followup; stopped Lexapro after 3 weeks, could not tell any difference after taking, thinks it caused urinary hesitancy)   HPI Anxiety and depression Patient is coming in for recheck on anxiety and depression. He stopped the Lexapro because he felt like it was causing urinary hesitancy and he did not feel like it was helping. He would like to try something different. His significant other is currently on Effexor and he thinks that would be a valid thing to try. He denies any suicidal ideations or thoughts of hurting himself. He does have a lot of anxiety and feels like that is the biggest thing that was not helping with. Patient does have thoughts of being better off dead but not any thoughts of actually hurting himself Depression screen [facility] 2/9 01/17/2017 12/13/2016 03/14/2016 01/12/2016 12/13/2015  Decreased Interest 1 1 0 0 0  Down, Depressed, Hopeless 2 3 0 0 0  PHQ - 2 Score 3 4 0 0 0  Altered sleeping 3 1 - - -  Tired, decreased energy 1 2 - - -  Change in appetite 1 3 - - -  Feeling bad or failure about yourself  2 3 - - -  Trouble concentrating 0 2 - - -  Moving slowly or fidgety/restless 0 0 - - -  Suicidal thoughts 1 1 - - -  PHQ-9 Score 11 16 - - -  Difficult doing work/chores Somewhat difficult Very difficult - - -     Relevant past medical, surgical, family and social history reviewed and updated as indicated. Interim medical history since our last visit reviewed. Allergies and medications reviewed and updated.  Review of Systems  Constitutional: Negative for chills and fever.  Eyes: Negative for discharge.  Respiratory: Negative for shortness of breath and wheezing.     Cardiovascular: Negative for chest pain and leg swelling.  Musculoskeletal: Negative for back pain and gait problem.  Skin: Negative for rash.  Neurological: Negative for weakness and numbness.  Psychiatric/Behavioral: Positive for decreased concentration, dysphoric mood and sleep disturbance. Negative for self-injury and suicidal ideas. The patient is nervous/anxious.   All other systems reviewed and are negative.   Per HPI unless specifically indicated above     Objective:    BP 127/81   Pulse 78   Temp 98.7 F (37.1 C) (Oral)   Ht 5\' 9"  (1.753 m)   Wt 260 lb (117.9 kg)   BMI 38.40 kg/m   Wt Readings from Last 3 Encounters:  01/17/17 260 lb (117.9 kg)  12/13/16 263 lb (119.3 kg)  03/14/16 270 lb 12.8 oz (122.8 kg)    Physical Exam  Constitutional: He is oriented to person, place, and time. He appears well-developed and well-nourished. No distress.  Eyes: Conjunctivae are normal. No scleral icterus.  Cardiovascular: Normal rate, regular rhythm, normal heart sounds and intact distal pulses.   No murmur heard. Pulmonary/Chest: Effort normal and breath sounds normal. No respiratory distress. He has no wheezes.  Musculoskeletal: Normal range of motion. He exhibits no edema.  Neurological: He is alert and oriented to person,  place, and time. Coordination normal.  Skin: Skin is warm and dry. No rash noted. He is not diaphoretic.  Psychiatric: His behavior is normal. Judgment normal. His mood appears anxious. He exhibits a depressed mood. He expresses no suicidal ideation. He expresses no suicidal plans.  Nursing note and vitals reviewed.     Assessment & Plan:   Problem List Items Addressed This Visit      Other   Anxiety and depression - Primary   Relevant Medications   venlafaxine XR (EFFEXOR XR) 37.5 MG 24 hr capsule   venlafaxine XR (EFFEXOR XR) 75 MG 24 hr capsule   hydrOXYzine (ATARAX/VISTARIL) 25 MG tablet       Follow up plan: Return in about 4 weeks (around  02/14/2017), or if symptoms worsen or fail to improve, for Recheck anxiety and depression.  Counseling provided for all of the vaccine components No orders of the defined types were placed in this encounter.   Arville Care, MD [facility] 01/17/2017, 1:57 PM

## 2017-02-21 ENCOUNTER — Encounter: Payer: Self-pay | Admitting: Family Medicine

## 2017-02-21 ENCOUNTER — Ambulatory Visit (INDEPENDENT_AMBULATORY_CARE_PROVIDER_SITE_OTHER): Payer: 59 | Admitting: Family Medicine

## 2017-02-21 VITALS — BP 122/78 | HR 79 | Temp 98.1°F | Ht 69.0 in | Wt 256.0 lb

## 2017-02-21 DIAGNOSIS — F329 Major depressive disorder, single episode, unspecified: Secondary | ICD-10-CM

## 2017-02-21 DIAGNOSIS — F419 Anxiety disorder, unspecified: Secondary | ICD-10-CM

## 2017-02-21 MED ORDER — VENLAFAXINE HCL ER 150 MG PO CP24: 150.0000 mg | ORAL_CAPSULE | Freq: Every day | ORAL | 1 refills | Status: DC

## 2017-02-21 NOTE — Progress Notes (Signed)
BP 122/78   Pulse 79   Temp 98.1 F (36.7 C) (Oral)   Ht 5\' 9"  (1.753 m)   Wt 256 lb (116.1 kg)   BMI 37.80 kg/m    Subjective:    Patient ID: Isaac Scott, male    DOB: 1990-10-30, 26 y.o.   MRN: [id number]  HPI: Isaac Scott is a 26 y.o. male presenting on 02/21/2017 for Anxiety/Depression (followup)   HPI Anxiety depression check Patient is coming in today for anxiety depression recheck. Is doing a lot better but not completely there and would like to go up on the Effexor again to see if it can help. He says he does not have any side effects such as urination like Previously on any other medication and has been doing relatively very well. He denies any suicidal ideations or thoughts of hurting himself. Depression screen [facility] 2/9 02/21/2017 01/17/2017 12/13/2016 03/14/2016 01/12/2016  Decreased Interest 1 1 1  0 0  Down, Depressed, Hopeless 1 2 3  0 0  PHQ - 2 Score 2 3 4  0 0  Altered sleeping 0 3 1 - -  Tired, decreased energy 3 1 2  - -  Change in appetite 0 1 3 - -  Feeling bad or failure about yourself  0 2 3 - -  Trouble concentrating 0 0 2 - -  Moving slowly or fidgety/restless 0 0 0 - -  Suicidal thoughts 0 1 1 - -  PHQ-9 Score 5 11 16  - -  Difficult doing work/chores Not difficult at all Somewhat difficult Very difficult - -     Relevant past medical, surgical, family and social history reviewed and updated as indicated. Interim medical history since our last visit reviewed. Allergies and medications reviewed and updated.  Review of Systems  Constitutional: Negative for chills and fever.  Respiratory: Negative for shortness of breath and wheezing.   Cardiovascular: Negative for chest pain and leg swelling.  Musculoskeletal: Negative for back pain and gait problem.  Skin: Negative for rash.  Psychiatric/Behavioral: Positive for dysphoric mood. Negative for self-injury, sleep disturbance and suicidal ideas. The patient is nervous/anxious.   All other systems reviewed  and are negative.   Per HPI unless specifically indicated above      Objective:    BP 122/78   Pulse 79   Temp 98.1 F (36.7 C) (Oral)   Ht 5\' 9"  (1.753 m)   Wt 256 lb (116.1 kg)   BMI 37.80 kg/m   Wt Readings from Last 3 Encounters:  02/21/17 256 lb (116.1 kg)  01/17/17 260 lb (117.9 kg)  12/13/16 263 lb (119.3 kg)    Physical Exam  Constitutional: He is oriented to person, place, and time. He appears well-developed and well-nourished. No distress.  Eyes: Conjunctivae are normal. No scleral icterus.  Cardiovascular: Normal rate, regular rhythm, normal heart sounds and intact distal pulses.   No murmur heard. Pulmonary/Chest: Effort normal and breath sounds normal. No respiratory distress. He has no wheezes. He has no rales.  Musculoskeletal: Normal range of motion. He exhibits no edema.  Neurological: He is alert and oriented to person, place, and time. Coordination normal.  Skin: Skin is warm and dry. No rash noted. He is not diaphoretic.  Psychiatric: His behavior is normal. Judgment normal. His mood appears anxious. He exhibits a depressed mood. He expresses no suicidal ideation. He expresses no suicidal plans.  Nursing note and vitals reviewed.     Assessment & Plan:   Problem List Items  Addressed This Visit      Other   Anxiety and depression - Primary   Relevant Medications   venlafaxine XR (EFFEXOR XR) 150 MG 24 hr capsule       Follow up plan: Return in about 3 months (around 05/24/2017), or if symptoms worsen or fail to improve, for Anxiety depression recheck.  Counseling provided for all of the vaccine components No orders of the defined types were placed in this encounter.   Arville Care, MD [facility] 02/21/2017, 2:18 PM

## 2017-05-30 ENCOUNTER — Ambulatory Visit (INDEPENDENT_AMBULATORY_CARE_PROVIDER_SITE_OTHER): Payer: 59 | Admitting: Family Medicine

## 2017-05-30 ENCOUNTER — Encounter: Payer: Self-pay | Admitting: Family Medicine

## 2017-05-30 ENCOUNTER — Ambulatory Visit: Payer: 59 | Admitting: Family Medicine

## 2017-05-30 VITALS — BP 119/82 | HR 80 | Temp 97.5°F | Ht 69.0 in | Wt 256.2 lb

## 2017-05-30 DIAGNOSIS — F339 Major depressive disorder, recurrent, unspecified: Secondary | ICD-10-CM | POA: Diagnosis not present

## 2017-05-30 DIAGNOSIS — F329 Major depressive disorder, single episode, unspecified: Secondary | ICD-10-CM

## 2017-05-30 DIAGNOSIS — F419 Anxiety disorder, unspecified: Secondary | ICD-10-CM

## 2017-05-30 DIAGNOSIS — F32A Depression, unspecified: Secondary | ICD-10-CM

## 2017-05-30 MED ORDER — BUSPIRONE HCL 10 MG PO TABS: 10.0000 mg | ORAL_TABLET | Freq: Two times a day (BID) | ORAL | 3 refills | Status: DC

## 2017-05-30 MED ORDER — VENLAFAXINE HCL ER 150 MG PO CP24: 150.0000 mg | ORAL_CAPSULE | Freq: Every day | ORAL | 1 refills | Status: DC

## 2017-05-30 NOTE — Progress Notes (Signed)
BP 119/82   Pulse 80   Temp (!) 97.5 F (36.4 C) (Oral)   Ht 5\' 9"  (1.753 m)   Wt 256 lb 3.2 oz (116.2 kg)   BMI 37.83 kg/m    Subjective:    Patient ID: Isaac Scott, male    DOB: 29-Jan-1991, 26 y.o.   MRN: [id number]  HPI: Isaac Scott is a 26 y.o. male presenting on 05/30/2017 for Follow-up (depression)   HPI Follow-up depression and anxiety Patient is coming in for follow-up depression and anxiety and says that he is feeling a lot better in regards to the depression but still has some anxiety that is more significant.  He says he likes the Effexor except for some urinary complaints but does not want to switch at this point and says the urinary issues are not too bad.  He is still having a lot of issues with his marriage and that is where a lot of his stress and anxiety stems from.  He is trying to get his wife to go to counseling for his marriage with him but is having difficulties with her being willing to do that.  He denies any suicidal ideations or thoughts of hurting himself. Depression screen [facility] 2/9 05/30/2017 02/21/2017 01/17/2017 12/13/2016 03/14/2016  Decreased Interest 0 1 1 1  0  Down, Depressed, Hopeless 0 1 2 3  0  PHQ - 2 Score 0 2 3 4  0  Altered sleeping - 0 3 1 -  Tired, decreased energy - 3 1 2  -  Change in appetite - 0 1 3 -  Feeling bad or failure about yourself  - 0 2 3 -  Trouble concentrating - 0 0 2 -  Moving slowly or fidgety/restless - 0 0 0 -  Suicidal thoughts - 0 1 1 -  PHQ-9 Score - 5 11 16  -  Difficult doing work/chores - Not difficult at all Somewhat difficult Very difficult -     Relevant past medical, surgical, family and social history reviewed and updated as indicated. Interim medical history since our last visit reviewed. Allergies and medications reviewed and updated.  Review of Systems  Constitutional: Negative for chills and fever.  Respiratory: Negative for shortness of breath and wheezing.   Cardiovascular: Negative for chest pain  and leg swelling.  Musculoskeletal: Negative for back pain and gait problem.  Skin: Negative for rash.  Psychiatric/Behavioral: Positive for dysphoric mood. Negative for decreased concentration, self-injury, sleep disturbance and suicidal ideas. The patient is nervous/anxious.   All other systems reviewed and are negative.   Per HPI unless specifically indicated above      Objective:    BP 119/82   Pulse 80   Temp (!) 97.5 F (36.4 C) (Oral)   Ht 5\' 9"  (1.753 m)   Wt 256 lb 3.2 oz (116.2 kg)   BMI 37.83 kg/m   Wt Readings from Last 3 Encounters:  05/30/17 256 lb 3.2 oz (116.2 kg)  02/21/17 256 lb (116.1 kg)  01/17/17 260 lb (117.9 kg)    Physical Exam  Constitutional: He is oriented to person, place, and time. He appears well-developed and well-nourished. No distress.  Eyes: Conjunctivae are normal. No scleral icterus.  Neck: Neck supple. No thyromegaly present.  Cardiovascular: Normal rate, regular rhythm, normal heart sounds and intact distal pulses.   No murmur heard. Pulmonary/Chest: Effort normal and breath sounds normal. No respiratory distress. He has no wheezes. He has no rales.  Musculoskeletal: Normal range of motion. He exhibits  no edema.  Lymphadenopathy:    He has no cervical adenopathy.  Neurological: He is alert and oriented to person, place, and time. Coordination normal.  Skin: Skin is warm and dry. No rash noted. He is not diaphoretic.  Psychiatric: His behavior is normal. His mood appears anxious. He exhibits a depressed mood. He expresses no suicidal ideation. He expresses no suicidal plans.  Nursing note and vitals reviewed.      Assessment & Plan:   Problem List Items Addressed This Visit      Other   Depression, recurrent (HCC) - Primary   Relevant Medications   venlafaxine XR (EFFEXOR XR) 150 MG 24 hr capsule   busPIRone (BUSPAR) 10 MG tablet    Other Visit Diagnoses    Anxiety and depression       Relevant Medications   venlafaxine XR  (EFFEXOR XR) 150 MG 24 hr capsule   busPIRone (BUSPAR) 10 MG tablet       Follow up plan: Return in about 3 months (around 08/30/2017), or if symptoms worsen or fail to improve, for Anxiety depression recheck.  Counseling provided for all of the vaccine components No orders of the defined types were placed in this encounter.   Arville CareJoshua Dettinger, MD [facility] 05/30/2017, 8:42 AM

## 2017-08-27 ENCOUNTER — Other Ambulatory Visit: Payer: Self-pay | Admitting: Family Medicine

## 2017-08-27 DIAGNOSIS — F419 Anxiety disorder, unspecified: Principal | ICD-10-CM

## 2017-08-27 DIAGNOSIS — F329 Major depressive disorder, single episode, unspecified: Secondary | ICD-10-CM

## 2017-08-27 NOTE — Telephone Encounter (Signed)
Last seen 05/30/17  Dr D

## 2017-09-05 ENCOUNTER — Ambulatory Visit: Payer: 59 | Admitting: Family Medicine

## 2017-09-09 ENCOUNTER — Encounter: Payer: Self-pay | Admitting: Family Medicine

## 2017-09-09 ENCOUNTER — Ambulatory Visit: Payer: 59 | Admitting: Family Medicine

## 2017-09-09 VITALS — BP 120/75 | HR 83 | Temp 97.4°F | Ht 69.0 in | Wt 260.0 lb

## 2017-09-09 DIAGNOSIS — G4489 Other headache syndrome: Secondary | ICD-10-CM

## 2017-09-09 DIAGNOSIS — F339 Major depressive disorder, recurrent, unspecified: Secondary | ICD-10-CM | POA: Diagnosis not present

## 2017-09-09 MED ORDER — VENLAFAXINE HCL ER 75 MG PO CP24: 75.0000 mg | ORAL_CAPSULE | Freq: Every day | ORAL | 1 refills | Status: DC

## 2017-09-09 NOTE — Progress Notes (Signed)
BP 120/75   Pulse 83   Temp (!) 97.4 F (36.3 C) (Oral)   Ht 5\' 9"  (1.753 m)   Wt 260 lb (117.9 kg)   BMI 38.40 kg/m    Subjective:    Patient ID: Isaac Scott, male    DOB: Feb 21, 1991, 27 y.o.   MRN: [id number]  HPI: Isaac Scott is a 27 y.o. male presenting on 09/09/2017 for Anxierty/depression (3 mo follow up)   HPI Depression Patient is coming in today for anxiety and depression and feels like he is doing well and wants to start tapering off the medication and see how he does on it.  He denies any suicidal ideations or thoughts of hurting himself.. He does admit that when he misses a day on the Effexor he does feel the effects of it, this is why he must taper off. Depression screen [facility] 2/9 09/09/2017 05/30/2017 02/21/2017 01/17/2017 12/13/2016  Decreased Interest 0 0 1 1 1   Down, Depressed, Hopeless 0 0 1 2 3   PHQ - 2 Score 0 0 2 3 4   Altered sleeping - - 0 3 1  Tired, decreased energy - - 3 1 2   Change in appetite - - 0 1 3  Feeling bad or failure about yourself  - - 0 2 3  Trouble concentrating - - 0 0 2  Moving slowly or fidgety/restless - - 0 0 0  Suicidal thoughts - - 0 1 1  PHQ-9 Score - - 5 11 16   Difficult doing work/chores - - Not difficult at all Somewhat difficult Very difficult    Headaches Patient comes in complaining of almost daily headaches is been going on as long as he can remember for at least the past 5 years.  He says that this is been an issue that he is thought he takes Tylenol for it which does help stop the headaches but then they come back.  He says that if he does not take the Tylenol then it will last all day and he describes them as severe.  He describes it as a throbbing and pulsating sensation in the top of his head near the front.  He denies any blurred vision or aura or trouble with lights or sounds.  He denies any worsening of the headaches compared to what he is always had but that they have just been the same.  Relevant past medical,  surgical, family and social history reviewed and updated as indicated. Interim medical history since our last visit reviewed. Allergies and medications reviewed and updated.  Review of Systems  Constitutional: Negative for chills and fever.  HENT: Negative for congestion.   Eyes: Negative for photophobia and visual disturbance.  Respiratory: Negative for shortness of breath and wheezing.   Cardiovascular: Negative for chest pain and leg swelling.  Musculoskeletal: Negative for back pain and gait problem.  Skin: Negative for rash.  Neurological: Positive for headaches. Negative for dizziness and light-headedness.  Psychiatric/Behavioral: Negative for decreased concentration, self-injury, sleep disturbance and suicidal ideas. The patient is not nervous/anxious.   All other systems reviewed and are negative.   Per HPI unless specifically indicated above   Allergies as of 09/09/2017      Reactions   Naproxen Hives      Medication List        Accurate as of 09/09/17  9:30 AM. Always use your most recent med list.          venlafaxine XR 75 MG 24 hr  capsule Commonly known as:  EFFEXOR XR Take 1 capsule (75 mg total) by mouth daily with breakfast.          Objective:    BP 120/75   Pulse 83   Temp (!) 97.4 F (36.3 C) (Oral)   Ht 5\' 9"  (1.753 m)   Wt 260 lb (117.9 kg)   BMI 38.40 kg/m   Wt Readings from Last 3 Encounters:  09/09/17 260 lb (117.9 kg)  05/30/17 256 lb 3.2 oz (116.2 kg)  02/21/17 256 lb (116.1 kg)    Physical Exam  Constitutional: He is oriented to person, place, and time. He appears well-developed and well-nourished. No distress.  Eyes: Conjunctivae are normal. No scleral icterus.  Neck: Neck supple. No thyromegaly present.  Cardiovascular: Normal rate, regular rhythm, normal heart sounds and intact distal pulses.  No murmur heard. Pulmonary/Chest: Effort normal and breath sounds normal. No respiratory distress. He has no wheezes. He has no rales.    Musculoskeletal: Normal range of motion. He exhibits no edema.  Lymphadenopathy:    He has no cervical adenopathy.  Neurological: He is alert and oriented to person, place, and time. Coordination normal.  Skin: Skin is warm and dry. No rash noted. He is not diaphoretic.  Psychiatric: He has a normal mood and affect. His behavior is normal. His mood appears not anxious. He does not exhibit a depressed mood. He expresses no suicidal ideation. He expresses no suicidal plans.  Nursing note and vitals reviewed.      Assessment & Plan:   Problem List Items Addressed This Visit      Other   Depression, recurrent (HCC) - Primary   Relevant Medications   venlafaxine XR (EFFEXOR-XR) 75 MG 24 hr capsule    Other Visit Diagnoses    Other headache syndrome       Concern for rebound headaches, recommended for him to stop taking Tylenol altogether.   Relevant Medications   venlafaxine XR (EFFEXOR-XR) 75 MG 24 hr capsule       Follow up plan: Return in about 3 months (around 12/07/2017), or if symptoms worsen or fail to improve, for Call back in 4 weeks to see if he wants to keep taper, see back in 3 months.  Counseling provided for all of the vaccine components No orders of the defined types were placed in this encounter.   Arville Care, MD [facility] 09/09/2017, 9:30 AM

## 2017-10-01 ENCOUNTER — Ambulatory Visit: Payer: 59 | Admitting: Family Medicine

## 2017-10-17 ENCOUNTER — Ambulatory Visit: Payer: 59 | Admitting: Family Medicine

## 2017-10-21 ENCOUNTER — Encounter: Payer: Self-pay | Admitting: Family Medicine

## 2017-11-18 ENCOUNTER — Ambulatory Visit: Payer: 59 | Admitting: Family Medicine

## 2017-11-18 ENCOUNTER — Other Ambulatory Visit: Payer: Self-pay | Admitting: Family Medicine

## 2017-11-18 ENCOUNTER — Encounter: Payer: Self-pay | Admitting: Family Medicine

## 2017-11-18 VITALS — BP 106/69 | HR 70 | Temp 97.6°F | Ht 69.0 in | Wt 247.0 lb

## 2017-11-18 DIAGNOSIS — M5136 Other intervertebral disc degeneration, lumbar region: Secondary | ICD-10-CM | POA: Diagnosis not present

## 2017-11-18 DIAGNOSIS — F329 Major depressive disorder, single episode, unspecified: Secondary | ICD-10-CM

## 2017-11-18 DIAGNOSIS — G8929 Other chronic pain: Secondary | ICD-10-CM | POA: Diagnosis not present

## 2017-11-18 DIAGNOSIS — M4120 Other idiopathic scoliosis, site unspecified: Secondary | ICD-10-CM

## 2017-11-18 DIAGNOSIS — M5441 Lumbago with sciatica, right side: Secondary | ICD-10-CM | POA: Diagnosis not present

## 2017-11-18 DIAGNOSIS — F32A Depression, unspecified: Secondary | ICD-10-CM

## 2017-11-18 DIAGNOSIS — F419 Anxiety disorder, unspecified: Principal | ICD-10-CM

## 2017-11-18 MED ORDER — CYCLOBENZAPRINE HCL 10 MG PO TABS: 5.0000 mg | ORAL_TABLET | Freq: Three times a day (TID) | ORAL | 0 refills | Status: DC | PRN

## 2017-11-18 MED ORDER — MELOXICAM 15 MG PO TABS: 7.5000 mg | ORAL_TABLET | Freq: Every day | ORAL | 0 refills | Status: DC

## 2017-11-18 NOTE — Patient Instructions (Signed)
I have prescribed you muscle relaxer, which you can take 3 times a day.  We discussed that this can cause sleepiness so do not operate heavy machinery or drive while taking.  I have also prescribed you meloxicam to take once daily for back pain.  You have been referred to the orthopedist.  You will be contacted with an appointment.  You have prescribed a nonsteroidal anti-inflammatory drug (NSAID) today. This will help with ear pain and inflammation. Please do not take any other NSAIDs (ibuprofen/Motrin/Advil, naproxen/Aleve, meloxicam/Mobic, Voltaren/diclofenac). Please make sure to eat a meal when taking this medication.   Caution:  If you have a history of acid reflux/indigestion, I recommend that you take an antacid (such as Prilosec, Prevacid) daily while on the NSAID.  If you have a history of bleeding disorder, gastric ulcer, are on a blood thinner (like warfarin/Coumadin, Xarelto, Eliquis, etc) please do not take NSAID.  If you have ever had a heart attack, you should not take NSAIDs.   Back Pain, Adult Back pain is very common. The pain often gets better over time. The cause of back pain is usually not dangerous. Most people can learn to manage their back pain on their own. Follow these instructions at home: Watch your back pain for any changes. The following actions may help to lessen any pain you are feeling:  Stay active. Start with short walks on flat ground if you can. Try to walk farther each day.  Exercise regularly as told by your doctor. Exercise helps your back heal faster. It also helps avoid future injury by keeping your muscles strong and flexible.  Do not sit, drive, or stand in one place for more than 30 minutes.  Do not stay in bed. Resting more than 1-2 days can slow down your recovery.  Be careful when you bend or lift an object. Use good form when lifting: ? Bend at your knees. ? Keep the object close to your body. ? Do not twist.  Sleep on a firm  mattress. Lie on your side, and bend your knees. If you lie on your back, put a pillow under your knees.  Take medicines only as told by your doctor.  Put ice on the injured area. ? Put ice in a plastic bag. ? Place a towel between your skin and the bag. ? Leave the ice on for 20 minutes, 2-3 times a day for the first 2-3 days. After that, you can switch between ice and heat packs.  Avoid feeling anxious or stressed. Find good ways to deal with stress, such as exercise.  Maintain a healthy weight. Extra weight puts stress on your back.  Contact a doctor if:  You have pain that does not go away with rest or medicine.  You have worsening pain that goes down into your legs or buttocks.  You have pain that does not get better in one week.  You have pain at night.  You lose weight.  You have a fever or chills. Get help right away if:  You cannot control when you poop (bowel movement) or pee (urinate).  Your arms or legs feel weak.  Your arms or legs lose feeling (numbness).  You feel sick to your stomach (nauseous) or throw up (vomit).  You have belly (abdominal) pain.  You feel like you may pass out (faint). This information is not intended to replace advice given to you by your health care provider. Make sure you discuss any questions you have with  your health care provider. Document Released: 01/08/2008 Document Revised: 12/28/2015 Document Reviewed: 11/23/2013 Elsevier Interactive Patient Education  [employer].

## 2017-11-18 NOTE — Progress Notes (Signed)
Subjective: CC: low back pain PCP: Dettinger, Elige Radon, MD ZOX:[id number] A Mau is a 27 y.o. male presenting to clinic today for:  1. Low back pain Patient reports a 2-year history of low back pain.  He was previously seen for left-sided low back pain and sent to orthopedics for further evaluation.  He was noted to have degenerative changes within the lumbar spine as well as scoliosis.  He was treated with physical therapy, which she does not feel particularly helped him.  He notes that back pain in the improving after they changed mattresses.  Today, he notes that over the weekend he started having some discomfort in the low back with predominance on the right side this time.  Pain worsened at work yesterday.  He notes that pain is worse with getting up from a seated position and with flexion of the spine.  He notes bilateral lower extremity numbness and tingling that is intermittent.  Patient thinks that symptoms may be a result of him returning to the gym, as he had previously not been working out for some time.  Denies any falls, weakness or preceding injury.  He has been using ibuprofen at home and stretching with little improvement in symptoms.  Denies any saddle anesthesia, fecal incontinence or urinary retention.  He notes that he prefers to be seen by the specialist again for this issue.    ROS: Per HPI  Allergies  Allergen Reactions  . Naproxen Hives   Past Medical History:  Diagnosis Date  . Generalized headaches   . Hemorrhoids     Current Outpatient Medications:  .  venlafaxine XR (EFFEXOR-XR) 75 MG 24 hr capsule, Take 1 capsule (75 mg total) by mouth daily with breakfast., Disp: 30 capsule, Rfl: 1 Social History   Socioeconomic History  . Marital status: Married    Spouse name: Not on file  . Number of children: 1  . Years of education: Not on file  . Highest education level: Not on file  Occupational History  . Occupation: HVAC  Social Needs  . Financial resource  strain: Not on file  . Food insecurity:    Worry: Not on file    Inability: Not on file  . Transportation needs:    Medical: Not on file    Non-medical: Not on file  Tobacco Use  . Smoking status: Never Smoker  . Smokeless tobacco: Current User    Types: Chew  Substance and Sexual Activity  . Alcohol use: No  . Drug use: No  . Sexual activity: Not on file  Lifestyle  . Physical activity:    Days per week: Not on file    Minutes per session: Not on file  . Stress: Not on file  Relationships  . Social connections:    Talks on phone: Not on file    Gets together: Not on file    Attends religious service: Not on file    Active member of club or organization: Not on file    Attends meetings of clubs or organizations: Not on file    Relationship status: Not on file  . Intimate partner violence:    Fear of current or ex partner: Not on file    Emotionally abused: Not on file    Physically abused: Not on file    Forced sexual activity: Not on file  Other Topics Concern  . Not on file  Social History Narrative   2 caffeine drinks daily    Family History  Problem Relation Age of Onset  . Coronary artery disease Maternal Grandmother   . Coronary artery disease Maternal Grandfather   . Colon cancer Neg Hx     Objective: Office vital signs reviewed. BP 106/69   Pulse 70   Temp 97.6 F (36.4 C) (Oral)   Ht 5\' 9"  (1.753 m)   Wt 247 lb (112 kg)   BMI 36.48 kg/m   Physical Examination:  General: Awake, alert, obese, No acute distress MSK: Ambulates independently.  Lumbar spine: Patient has full active range of motion in extension and rotation.  He does have some pain with flexion of the lumbar spine.  No midline tenderness to palpation.  He does have right-sided tenderness to palpation over the lumbosacral junction.  There are no palpable bony abnormalities.  He does have a positive straight leg raise on the right. Neuro: LE strength 4/5 bilaterally.  Light touch sensation  grossly intact.  Patellar DTRs 1/4 bilaterally.  Assessment/ Plan: 27 y.o. male   1.  Acute on chronic chronic right-sided low back pain with right-sided sciatica This is an acute on chronic issue.  I did offer him oral medications to see if we can improve pain.  He is willing to try medications but does want to see the orthopedist.  Physical therapy also offered but patient declined.  I sent him in muscle relaxer and meloxicam.  Home care instructions were reviewed.  Referral to orthopedics placed.  Work note excusing for the next week provided.  Reasons for emergent evaluation emergency department discussed.  Patient was good understanding and will follow-up as needed. - Ambulatory referral to Orthopedic Surgery - meloxicam (MOBIC) 15 MG tablet; Take 0.5-1 tablets (7.5-15 mg total) by mouth daily.  Dispense: 30 tablet; Refill: 0 - cyclobenzaprine (FLEXERIL) 10 MG tablet; Take 0.5-1 tablets (5-10 mg total) by mouth 3 (three) times daily as needed for muscle spasms.  Dispense: 30 tablet; Refill: 0  2. Idiopathic scoliosis in adult patient - Ambulatory referral to Orthopedic Surgery  3. Degenerative disc disease, lumbar - Ambulatory referral to Orthopedic Surgery   Orders Placed This Encounter  Procedures  . Ambulatory referral to Orthopedic Surgery    Referral Priority:   Routine    Referral Type:   Surgical    Referral Reason:   Specialty Services Required    Requested Specialty:   Orthopedic Surgery    Number of Visits Requested:   1   Meds ordered this encounter  Medications  . meloxicam (MOBIC) 15 MG tablet    Sig: Take 0.5-1 tablets (7.5-15 mg total) by mouth daily.    Dispense:  30 tablet    Refill:  0  . cyclobenzaprine (FLEXERIL) 10 MG tablet    Sig: Take 0.5-1 tablets (5-10 mg total) by mouth 3 (three) times daily as needed for muscle spasms.    Dispense:  30 tablet    Refill:  0     Lucinda Spells Hulen Skains, DO [facility] [phone]

## 2017-11-25 ENCOUNTER — Encounter: Payer: Self-pay | Admitting: Family Medicine

## 2017-11-25 ENCOUNTER — Ambulatory Visit: Payer: 59 | Admitting: Family Medicine

## 2017-11-25 VITALS — BP 124/77 | HR 72 | Temp 97.0°F | Ht 69.0 in | Wt 248.0 lb

## 2017-11-25 DIAGNOSIS — M5441 Lumbago with sciatica, right side: Secondary | ICD-10-CM | POA: Diagnosis not present

## 2017-11-25 DIAGNOSIS — M5136 Other intervertebral disc degeneration, lumbar region: Secondary | ICD-10-CM

## 2017-11-25 DIAGNOSIS — M4120 Other idiopathic scoliosis, site unspecified: Secondary | ICD-10-CM

## 2017-11-25 DIAGNOSIS — G8929 Other chronic pain: Secondary | ICD-10-CM

## 2017-11-25 NOTE — Progress Notes (Signed)
Subjective: CC: Acute on chronic back pain PCP: Dettinger, Elige Radon, MD VHQ:IONGE A Klingensmith is a 27 y.o. male presenting to clinic today for:  1. Acute on chronic back pain Patient was seen 11/18/2017 for acute on chronic right-sided low back pain.  He was referred to orthopedics for further evaluation and management.  He was also prescribed Flexeril and meloxicam as well as home physical therapy exercises.  He presents today and notes that he continues to have low back pain.  He is scheduled to see Dr. Ophelia Charter on 12/04/2017.  He is coming in today to see if he can get a work note until he is seen at that time.   He notes that the meloxicam has helped some with the back pain but it still is persistent.  It is not changed in character or severity since last visit.  Back pain continues to be right-sided.  Denies any saddle anesthesia, fecal incontinence or urinary retention.  No LE weakness or numbness and tingling.  ROS: Per HPI  Allergies  Allergen Reactions  . Naproxen Hives   Past Medical History:  Diagnosis Date  . Generalized headaches   . Hemorrhoids     Current Outpatient Medications:  .  cyclobenzaprine (FLEXERIL) 10 MG tablet, Take 0.5-1 tablets (5-10 mg total) by mouth 3 (three) times daily as needed for muscle spasms., Disp: 30 tablet, Rfl: 0 .  meloxicam (MOBIC) 15 MG tablet, Take 0.5-1 tablets (7.5-15 mg total) by mouth daily., Disp: 30 tablet, Rfl: 0 .  venlafaxine XR (EFFEXOR-XR) 75 MG 24 hr capsule, TAKE 1 CAPSULE (75 MG TOTAL) BY MOUTH DAILY WITH BREAKFAST., Disp: 30 capsule, Rfl: 0 Social History   Socioeconomic History  . Marital status: Married    Spouse name: Not on file  . Number of children: 1  . Years of education: Not on file  . Highest education level: Not on file  Occupational History  . Occupation: HVAC  Social Needs  . Financial resource strain: Not on file  . Food insecurity:    Worry: Not on file    Inability: Not on file  . Transportation needs:      Medical: Not on file    Non-medical: Not on file  Tobacco Use  . Smoking status: Never Smoker  . Smokeless tobacco: Current User    Types: Chew  Substance and Sexual Activity  . Alcohol use: No  . Drug use: No  . Sexual activity: Not on file  Lifestyle  . Physical activity:    Days per week: Not on file    Minutes per session: Not on file  . Stress: Not on file  Relationships  . Social connections:    Talks on phone: Not on file    Gets together: Not on file    Attends religious service: Not on file    Active member of club or organization: Not on file    Attends meetings of clubs or organizations: Not on file    Relationship status: Not on file  . Intimate partner violence:    Fear of current or ex partner: Not on file    Emotionally abused: Not on file    Physically abused: Not on file    Forced sexual activity: Not on file  Other Topics Concern  . Not on file  Social History Narrative   2 caffeine drinks daily    Family History  Problem Relation Age of Onset  . Coronary artery disease Maternal Grandmother   .  Coronary artery disease Maternal Grandfather   . Colon cancer Neg Hx     Objective: Office vital signs reviewed. BP 124/77   Pulse 72   Temp (!) 97 F (36.1 C) (Oral)   Ht 5\' 9"  (1.753 m)   Wt 248 lb (112.5 kg)   BMI 36.62 kg/m   Physical Examination:  General: Awake, alert, well nourished, No acute distress MSK: Ambulates independently but gait is antalgic.  Lumbar spine: Patient has full active range of motion in extension and rotation.  Persistent discomfort with flexion of the lumbar spine.  Assessment/ Plan: 27 y.o. male   1. Chronic right-sided low back pain with right-sided sciatica Patient has orthopedics referral in place.  No red flags on exam or from history.  Continue meloxicam since this is helping some.  Work note provided excusing through 5 2.  Further work restrictions per orthopedics.  Reasons for emergent evaluation the  emergency department discussed.  Patient was good understanding will follow-up as needed.  2. Degenerative disc disease, lumbar  3. Idiopathic scoliosis in adult patient   Raliegh Ip, DO [facility] [phone]

## 2017-12-04 ENCOUNTER — Ambulatory Visit (INDEPENDENT_AMBULATORY_CARE_PROVIDER_SITE_OTHER): Payer: Self-pay

## 2017-12-04 ENCOUNTER — Encounter (INDEPENDENT_AMBULATORY_CARE_PROVIDER_SITE_OTHER): Payer: Self-pay | Admitting: Orthopaedic Surgery

## 2017-12-04 ENCOUNTER — Ambulatory Visit (INDEPENDENT_AMBULATORY_CARE_PROVIDER_SITE_OTHER): Payer: 59 | Admitting: Orthopaedic Surgery

## 2017-12-04 VITALS — BP 117/63 | HR 61 | Ht 70.0 in | Wt 245.0 lb

## 2017-12-04 DIAGNOSIS — M5126 Other intervertebral disc displacement, lumbar region: Secondary | ICD-10-CM | POA: Diagnosis not present

## 2017-12-04 DIAGNOSIS — M545 Low back pain: Secondary | ICD-10-CM

## 2017-12-04 DIAGNOSIS — M25552 Pain in left hip: Secondary | ICD-10-CM

## 2017-12-04 DIAGNOSIS — M5136 Other intervertebral disc degeneration, lumbar region: Secondary | ICD-10-CM

## 2017-12-04 NOTE — Progress Notes (Addendum)
Office Visit Note/orthopedic consultation   Patient: Isaac Scott           Date of Birth: 04/19/1991           MRN: [id number] Visit Date: 12/04/2017              Requested by: Raliegh Ip, DO [address] [place], [place] [address] PCP: Dettinger, Elige Radon, MD   Assessment & Plan: Visit Diagnoses:  1. Acute right-sided low back pain, with sciatica presence unspecified   2. Pain in left hip   3. Bulge of lumbar disc without myelopathy   4.      Increased BMI due to excessive caloric intake  Plan: Patient is to work on weight loss.  Work slip given for work resumption on Monday 12/2017.  I plan to recheck him in a month.  We discussed the importance of weight loss to help his hip as well as his back.  A pad inserts given for his left shoe that he can use which may help level him out.  Recheck 1 month.  Thank the opportunity to see him in consultation.  Follow-Up Instructions: Return in about 1 month (around 01/04/2018).   Orders:  Orders Placed This Encounter  Procedures  . XR Lumbar Spine 2-3 Views  . XR HIP UNILAT W OR W/O PELVIS 2-3 VIEWS LEFT   No orders of the defined types were placed in this encounter.     Procedures: No procedures performed   Clinical Data: No additional findings.   Subjective: Chief Complaint  Patient presents with  . Lower Back - Pain    HPI 27 year old male seen with low back pain radiates in the legs and toes she is been out of work for 2 weeks had been working out at a fitness facility and started having increased back and right leg pain with numbness.  He works at [employer].  He has had problems with slipped capital femoral epiphysis which is fixed with a single screw at age 69 with chronic slip treated in [facility].  He states he has limitation of hip range of motion but does not seem to bother him a whole lot.  Principal problem is been back pain radiating down to the right leg onto the dorsum of the  foot.  He is used heat Tylenol meloxicam.  He took some Flexeril for period of time but stopped since it made him sleepy.  He has a mother that had bilateral slips that were pinned when she was younger also problems with lumbar disc degeneration.  No fever chills no history of rheumatologic conditions.  MRI scan lumbar in May 2017 showed subtle ligamentous disc protrusion L4-5 central and to the left without compression.  Mild facet arthropathy at L3-4 and L4-5.  L5-S1 level was normal.  Review of Systems 14 point review of system positive for previous left slipped capital femoral epiphysis screw fixation in [place] at age 49 or 70.  Positive for back pain.  His BMI is 35.   Objective: Vital Signs: BP 117/63   Pulse 61   Ht 5\' 10"  (1.778 m)   Wt 245 lb (111.1 kg)   BMI 35.15 kg/m   Physical Exam  Constitutional: He is oriented to person, place, and time. He appears well-developed and well-nourished.  HENT:  Head: Normocephalic and atraumatic.  Eyes: Pupils are equal, round, and reactive to light. EOM are normal.  Neck: No tracheal deviation  present. No thyromegaly present.  Cardiovascular: Normal rate.  Pulmonary/Chest: Effort normal. He has no wheezes.  Abdominal: Soft. Bowel sounds are normal.  Neurological: He is alert and oriented to person, place, and time.  Skin: Skin is warm and dry. Capillary refill takes less than 2 seconds.  Psychiatric: He has a normal mood and affect. His behavior is normal. Judgment and thought content normal.    Ortho Exam mild lumbar tenderness negative straight leg raising 90 degrees.  Knee and ankle jerk are 1+ and symmetrical.  Decreased sensation dorsum of the foot on the right.  No calf atrophy.  He can heel and toe walk.  Patient has discomfort with attempted internal rotation left hip which is 0 degrees he externally rotates 30.  Opposite hip shows normal internal/external rotation of 40 degrees.  Pulses are intact.  Specialty Comments:  No  specialty comments available.  Imaging: Xr Hip Unilat W Or W/o Pelvis 2-3 Views Left  Result Date: 12/04/2017 AP pelvis and frog-leg left hip obtained.  Shows evidence of old chronic slip fixed with a single screw the left.  There is shortening of the neck flattening of the head and acetabular degenerative changes with mild joint space narrowing.  Shortening on the left side as expected.  Right hip is normal. Impression: Old left hip slipped capital femoral epiphysis fixed with a single screw with early hip degenerative changes.  Xr Lumbar Spine 2-3 Views  Result Date: 12/04/2017 AP lateral and spot L5-S1 lumbar x-rays are obtained and reviewed.  Mild left lumbar curvature with pelvic obliquity nonweightbearing film.  No spondylolisthesis. Impression: Mild scoliosis left lumbar curve without acute changes.    PMFS History: Patient Active Problem List   Diagnosis Date Noted  . Pain in left hip 12/04/2017  . Chronic right-sided low back pain with right-sided sciatica 11/18/2017  . Degenerative disc disease, lumbar 11/18/2017  . Idiopathic scoliosis in adult patient 11/18/2017  . Depression, recurrent ([facility]) 12/13/2016   Past Medical History:  Diagnosis Date  . Generalized headaches   . Hemorrhoids     Family History  Problem Relation Age of Onset  . Coronary artery disease Maternal Grandmother   . Coronary artery disease Maternal Grandfather   . Colon cancer Neg Hx     Past Surgical History:  Procedure Laterality Date  . TOTAL HIP ARTHROPLASTY     Left    Social History   Occupational History  . Occupation: HVAC  Tobacco Use  . Smoking status: Never Smoker  . Smokeless tobacco: Current User    Types: Chew  Substance and Sexual Activity  . Alcohol use: No  . Drug use: No  . Sexual activity: Not on file

## 2017-12-15 ENCOUNTER — Other Ambulatory Visit: Payer: Self-pay | Admitting: Family Medicine

## 2017-12-15 DIAGNOSIS — M5441 Lumbago with sciatica, right side: Principal | ICD-10-CM

## 2017-12-15 DIAGNOSIS — F329 Major depressive disorder, single episode, unspecified: Secondary | ICD-10-CM

## 2017-12-15 DIAGNOSIS — G8929 Other chronic pain: Secondary | ICD-10-CM

## 2017-12-15 DIAGNOSIS — F419 Anxiety disorder, unspecified: Principal | ICD-10-CM

## 2017-12-19 ENCOUNTER — Ambulatory Visit (INDEPENDENT_AMBULATORY_CARE_PROVIDER_SITE_OTHER): Payer: 59

## 2017-12-19 ENCOUNTER — Ambulatory Visit (INDEPENDENT_AMBULATORY_CARE_PROVIDER_SITE_OTHER): Payer: 59 | Admitting: Family Medicine

## 2017-12-19 ENCOUNTER — Encounter: Payer: Self-pay | Admitting: Family Medicine

## 2017-12-19 VITALS — BP 109/74 | HR 79 | Temp 97.2°F | Ht 70.0 in | Wt 242.0 lb

## 2017-12-19 DIAGNOSIS — M25562 Pain in left knee: Secondary | ICD-10-CM

## 2017-12-19 DIAGNOSIS — M7989 Other specified soft tissue disorders: Secondary | ICD-10-CM | POA: Diagnosis not present

## 2017-12-19 DIAGNOSIS — S8992XA Unspecified injury of left lower leg, initial encounter: Secondary | ICD-10-CM | POA: Diagnosis not present

## 2017-12-19 DIAGNOSIS — S83412A Sprain of medial collateral ligament of left knee, initial encounter: Secondary | ICD-10-CM

## 2017-12-19 MED ORDER — PREDNISONE 20 MG PO TABS: ORAL_TABLET | ORAL | 0 refills | Status: DC

## 2017-12-19 NOTE — Patient Instructions (Signed)
Call back in 1 to 2 weeks and let me know how its going and consider orthopedic referral versus MRI

## 2017-12-19 NOTE — Progress Notes (Signed)
BP 109/74   Pulse 79   Temp (!) 97.2 F (36.2 C) (Oral)   Ht 5\' 10"  (1.778 m)   Wt 242 lb (109.8 kg)   BMI 34.72 kg/m    Subjective:    Patient ID: Isaac Scott, male    DOB: Mar 19, 1991, 27 y.o.   MRN: [id number]  HPI: Isaac Scott is a 27 y.o. male presenting on 12/19/2017 for Left knee pain (x 1 day, began after a fall)   HPI Left knee pain Patient comes in with left knee pain that started yesterday.  He says that he was in the back of his truck and was cleaning some things and slipped on his right foot and then twisted his left leg and knee to wear the lower leg and foot went lateral and his knee went medial.  Since then he has been having pain on the middle part of his knee and he took some Tylenol and it did not seem to help at all.  He rates the pain is moderate to severe and especially difficult trying to go up and down stairs but does have some pain as well with walking.  He denies any fevers or chills or redness or warmth.  He does have some swelling there and does say that it gives out on him sometimes  Relevant past medical, surgical, family and social history reviewed and updated as indicated. Interim medical history since our last visit reviewed. Allergies and medications reviewed and updated.  Review of Systems  Constitutional: Negative for chills and fever.  Respiratory: Negative for shortness of breath and wheezing.   Cardiovascular: Negative for chest pain and leg swelling.  Musculoskeletal: Positive for arthralgias and joint swelling. Negative for back pain and gait problem.  Skin: Negative for rash.  All other systems reviewed and are negative.   Per HPI unless specifically indicated above   Allergies as of 12/19/2017      Reactions   Naproxen Hives      Medication List        Accurate as of 12/19/17 12:13 PM. Always use your most recent med list.          predniSONE 20 MG tablet Commonly known as:  DELTASONE 2 po at same time daily for 5  days   venlafaxine XR 75 MG 24 hr capsule Commonly known as:  EFFEXOR-XR TAKE 1 CAPSULE (75 MG TOTAL) BY MOUTH DAILY WITH BREAKFAST.          Objective:    BP 109/74   Pulse 79   Temp (!) 97.2 F (36.2 C) (Oral)   Ht 5\' 10"  (1.778 m)   Wt 242 lb (109.8 kg)   BMI 34.72 kg/m   Wt Readings from Last 3 Encounters:  12/19/17 242 lb (109.8 kg)  12/04/17 245 lb (111.1 kg)  11/25/17 248 lb (112.5 kg)    Physical Exam  Constitutional: He is oriented to person, place, and time. He appears well-developed and well-nourished. No distress.  Musculoskeletal: Normal range of motion. He exhibits no edema.       Left knee: He exhibits swelling (Small amount of swelling, no bruising noted). He exhibits normal range of motion, no effusion, no deformity, no erythema, normal alignment, no LCL laxity, normal patellar mobility, no bony tenderness, normal meniscus and no MCL laxity (No MCL laxity noted but pain associated with MCL stressing). Tenderness found. Medial joint line tenderness noted.  Neurological: He is alert and oriented to person, place, and time.  Coordination normal.  Skin: Skin is warm and dry. No rash noted. He is not diaphoretic.  Psychiatric: He has a normal mood and affect. His behavior is normal.  Nursing note and vitals reviewed.   Left knee x-ray: Normal left knee x-ray    Assessment & Plan:   Problem List Items Addressed This Visit    None    Visit Diagnoses    Sprain of medial collateral ligament of left knee, initial encounter    -  Primary   Relevant Medications   predniSONE (DELTASONE) 20 MG tablet   Other Relevant Orders   DG Knee 1-2 Views Left (Completed)   Ambulatory referral to Physical Therapy     Call back in 1 to 2 weeks and let me know how its going and consider orthopedic referral versus MRI  Follow up plan: Return if symptoms worsen or fail to improve.  Counseling provided for all of the vaccine components Orders Placed This Encounter   Procedures  . DG Knee 1-2 Views Left  . Ambulatory referral to Physical Therapy    Arville Care, MD [facility] 12/19/2017, 12:13 PM

## 2017-12-24 ENCOUNTER — Ambulatory Visit: Payer: 59 | Attending: Family Medicine | Admitting: Physical Therapy

## 2017-12-24 ENCOUNTER — Other Ambulatory Visit: Payer: Self-pay

## 2017-12-24 ENCOUNTER — Encounter: Payer: Self-pay | Admitting: Physical Therapy

## 2017-12-24 DIAGNOSIS — R262 Difficulty in walking, not elsewhere classified: Secondary | ICD-10-CM | POA: Diagnosis not present

## 2017-12-24 DIAGNOSIS — M25562 Pain in left knee: Secondary | ICD-10-CM | POA: Insufficient documentation

## 2017-12-24 NOTE — Therapy (Addendum)
[facility] [place], [place], [address] Phone: [phone]   Fax:  [phone]  Physical Therapy Evaluation/Discharge  Patient Details  Name: Isaac Scott MRN: [id number] Date of Birth: Oct 15, 1990 Referring Provider: Vonna Kotyk Dettinger   Encounter Date: 12/24/2017  PT End of Session - 12/24/17 0858    Visit Number  1    Number of Visits  8    Date for PT Re-Evaluation  01/28/18    PT Start Time  0819    PT Stop Time  0900    PT Time Calculation (min)  41 min    Activity Tolerance  Patient tolerated treatment well    Behavior During Therapy  [facility] for tasks assessed/performed       Past Medical History:  Diagnosis Date  . Generalized headaches   . Hemorrhoids     Past Surgical History:  Procedure Laterality Date  . TOTAL HIP ARTHROPLASTY     Left     There were no vitals filed for this visit.   Subjective Assessment - 12/24/17 1022    Subjective  Patient arrives to physical therapy with left medial knee pain after a fall on 12/19/17. Patient reported as he fell, his left lower half of his leg turned outward. Patient reported he thought he was okay but when he went to work, pain became severe. Patient reported wearing his mother's knee brace for more stability. Patient pain currently is a throbbing 4/10. At worst, pain is 8-9/10 with standing for greater than 30 minutes and/or walking 10 minutes. Pain is relieved to a 2/10 with ice and rest. Patient is currently on medical leave until 12/30/17. Patient's goals are to decrease pain, improve strength and have less difficulties with work activities.    Limitations  Standing;Walking    How long can you stand comfortably?   30 min    How long can you walk comfortably?  10 min    Diagnostic tests  x-ray    Patient Stated Goals  get better    Currently in Pain?  Yes    Pain Score  4     Pain Location  Knee    Pain Orientation  Left;Medial    Pain Descriptors / Indicators   Throbbing    Pain Type  Acute pain    Pain Onset  1 to 4 weeks ago    Pain Frequency  Intermittent    Aggravating Factors   walking standing    Pain Relieving Factors  ice         [facility] PT Assessment - 12/24/17 0001      Assessment   Medical Diagnosis  Sprain of medial collateral ligament of the left knee    Referring Provider  Vonna Kotyk Dettinger    Onset Date/Surgical Date  12/19/17    Next MD Visit  none    Prior Therapy  no      Balance Screen   Has the patient fallen in the past 6 months  Yes    How many times?  1    Has the patient had a decrease in activity level because of a fear of falling?   No    Is the patient reluctant to leave their home because of a fear of falling?   No      Home Environment   Living Environment  Private residence    Living Arrangements  Spouse/significant other    Type of [place] Access  Stairs to enter    [employer] of Steps  5 steps on one entrance, 12 steps at other entrance    Entrance Stairs-Rails  Can reach both      ROM / Strength   AROM / PROM / Strength  AROM;PROM;Strength      AROM   Overall AROM   Deficits    AROM Assessment Site  Knee    Right/Left Knee  Left    Right Knee Extension  0    Right Knee Flexion  94      PROM   Overall PROM   Deficits    PROM Assessment Site  Knee    Right/Left Knee  Left    Left Knee Extension  0    Left Knee Flexion  110      Strength   Overall Strength Comments  left hip abduction 3+/5; Left hip flexion 4-/5    Strength Assessment Site  Knee    Right/Left Knee  Left    Left Knee Flexion  4+/5    Left Knee Extension  4/5      Special Tests    Special Tests  Knee Special Tests;Laxity/Instability Tests    Laxity/Instability   Anterior drawer test;Posterior drawer test    Knee Special tests   other      Anterior drawer test   Findings  Negative    Side  Left      Posterior drawer test   Findings  Negative    Side   Left      other    Findings  Positive     Side   Left    Comments  Left knee valgus stress test at 0 and 30 degrees      Ambulation/Gait   Gait Pattern  Step-through pattern;Decreased hip/knee flexion - left;Decreased stride length;Decreased step length - left                Objective measurements completed on examination: See above findings.      [facility] Adult PT Treatment/Exercise - 12/24/17 0001      Modalities   Modalities  Electrical Stimulation;Cryotherapy      Cryotherapy   Number Minutes Cryotherapy  15 Minutes    Cryotherapy Location  Knee    Type of Cryotherapy  Ice pack      Electrical Stimulation   Electrical Stimulation Location  Left medial knee    Electrical Stimulation Action  pre-mod    Electrical Stimulation Parameters  80-150 hz x10 min    Electrical Stimulation Goals  Pain                  PT Long Term Goals - 12/24/17 1026      PT LONG TERM GOAL #1   Title  Patient will be independent with HEP    Time  4    Period  Weeks    Status  New      PT LONG TERM GOAL #2   Title  Patient will demonstrate full left knee flexion AROM to perform functional activities.    Time  4    Period  Weeks    Status  New      PT LONG TERM GOAL #3   Title  Patient will demonstrate 5/5 left knee MMT to improve knee stability during functional activities.    Time  4    Status  New      PT LONG TERM GOAL #4   Title  Patient will report ability to stand for greater than 1 hour with less than 4/10 pain to perform work activities.    Time  4    Period  Weeks    Status  New             Plan - 12/24/17 1024    Clinical Impression Statement  Patient is a 27 year old male who presents to physical therapy with left medial knee pain. Patient demonstrates deficits with AROM and PROM as well as L knee and hip strength. Patient (+) with Left knee valgus stress test at 0 degrees and 30 degrees. (-) Anterior and posterior drawer test. Patient tender to palpation on medial aspect of knee and  medial joint line. Patient would benefit from skilled physical therapy to address deficits and address patient's goals.    Clinical Presentation  Stable    Clinical Decision Making  Low    Rehab Potential  Good    PT Frequency  2x / week    PT Duration  4 weeks    PT Treatment/Interventions  ADLs/Self Care Home Management;Gait training;Stair training;Therapeutic exercise;Therapeutic activities;Patient/family education;Manual techniques;Passive range of motion;Taping;Vasopneumatic Device;Electrical Stimulation;Moist Heat;Ultrasound;Cryotherapy;Balance training;Neuromuscular re-education    PT Next Visit Plan  elliptical, knee strengthening, modalities prn for pain relief    PT Home Exercise Plan  LAQ, sit to stands, hip abduction    Consulted and Agree with Plan of Care  Patient       Patient will benefit from skilled therapeutic intervention in order to improve the following deficits and impairments:  Pain, Decreased activity tolerance, Decreased range of motion, Decreased strength, Difficulty walking  Visit Diagnosis: Acute pain of left knee - Plan: PT plan of care cert/re-cert  Difficulty in walking, not elsewhere classified - Plan: PT plan of care cert/re-cert     Problem List Patient Active Problem List   Diagnosis Date Noted  . Pain in left hip 12/04/2017  . Chronic right-sided low back pain with right-sided sciatica 11/18/2017  . Degenerative disc disease, lumbar 11/18/2017  . Idiopathic scoliosis in adult patient 11/18/2017  . Depression, recurrent ([facility]) 12/13/2016   PHYSICAL THERAPY DISCHARGE SUMMARY  Visits from Start of Care: 1  Current functional level related to goals / functional outcomes: See above   Remaining deficits: No goals met   Education / Equipment: HEP Plan: Patient agrees to discharge.  Patient goals were not met. Patient is being discharged due to not returning since the last visit.  ?????       Gabriela Eves, PT, DPT 12/24/2017, 12:26  PM  [facility] [address] [place], [place], [address] Phone: [phone]   Fax:  [phone]  Name: Isaac Scott MRN: [id number] Date of Birth: March 04, 1991

## 2018-01-08 ENCOUNTER — Ambulatory Visit (INDEPENDENT_AMBULATORY_CARE_PROVIDER_SITE_OTHER): Payer: 59 | Admitting: Orthopaedic Surgery

## 2018-01-22 ENCOUNTER — Ambulatory Visit (INDEPENDENT_AMBULATORY_CARE_PROVIDER_SITE_OTHER): Payer: 59 | Admitting: Orthopaedic Surgery

## 2018-02-11 ENCOUNTER — Ambulatory Visit: Payer: 59 | Admitting: Family Medicine

## 2018-02-11 ENCOUNTER — Encounter: Payer: Self-pay | Admitting: Family Medicine

## 2018-02-11 VITALS — BP 123/79 | HR 73 | Temp 98.2°F | Ht 70.0 in | Wt 239.0 lb

## 2018-02-11 DIAGNOSIS — J019 Acute sinusitis, unspecified: Secondary | ICD-10-CM

## 2018-02-11 DIAGNOSIS — B9689 Other specified bacterial agents as the cause of diseases classified elsewhere: Secondary | ICD-10-CM

## 2018-02-11 MED ORDER — AMOXICILLIN-POT CLAVULANATE 875-125 MG PO TABS: 1.0000 | ORAL_TABLET | Freq: Two times a day (BID) | ORAL | 0 refills | Status: DC

## 2018-02-11 MED ORDER — BENZONATATE 200 MG PO CAPS: 200.0000 mg | ORAL_CAPSULE | Freq: Two times a day (BID) | ORAL | 0 refills | Status: DC | PRN

## 2018-02-11 NOTE — Patient Instructions (Signed)
- Get plenty of rest and drink plenty of fluids. - Try to breathe moist air. Use a cold mist humidifier. - Consume warm fluids (soup or tea) to provide relief for a stuffy nose and to loosen phlegm. - For nasal stuffiness, try saline nasal spray or a Neti Pot. Afrin nasal spray can also be used but this product should not be used longer than 3 days or it will cause rebound nasal stuffiness (worsening nasal congestion). - For sore throat pain relief: use chloraseptic spray, suck on throat lozenges, hard candy or popsicles; gargle with warm salt water (1/4 tsp. salt per 8 oz. of water); and eat soft, bland foods. - Eat a well-balanced diet. If you cannot, ensure you are getting enough nutrients by taking a daily multivitamin. - Avoid dairy products, as they can thicken phlegm. - Avoid alcohol, as it impairs your body's immune system.  CONTACT YOUR DOCTOR IF YOU EXPERIENCE ANY OF THE FOLLOWING: - High fever - Ear pain - Sinus-type headache - Unusually severe cold symptoms - Cough that gets worse while other cold symptoms improve - Flare up of any chronic lung problem, such as asthma - Your symptoms persist longer than 2 weeks  Sinusitis, Adult Sinusitis is soreness and inflammation of your sinuses. Sinuses are hollow spaces in the bones around your face. They are located:  Around your eyes.  In the middle of your forehead.  Behind your nose.  In your cheekbones.  Your sinuses and nasal passages are lined with a stringy fluid (mucus). Mucus normally drains out of your sinuses. When your nasal tissues get inflamed or swollen, the mucus can get trapped or blocked so air cannot flow through your sinuses. This lets bacteria, viruses, and funguses grow, and that leads to infection. Follow these instructions at home: Medicines  Take, use, or apply over-the-counter and prescription medicines only as told by your doctor. These may include nasal sprays.  If you were prescribed an antibiotic  medicine, take it as told by your doctor. Do not stop taking the antibiotic even if you start to feel better. Hydrate and Humidify  Drink enough water to keep your pee (urine) clear or pale yellow.  Use a cool mist humidifier to keep the humidity level in your home above 50%.  Breathe in steam for 10-15 minutes, 3-4 times a day or as told by your doctor. You can do this in the bathroom while a hot shower is running.  Try not to spend time in cool or dry air. Rest  Rest as much as possible.  Sleep with your head raised (elevated).  Make sure to get enough sleep each night. General instructions  Put a warm, moist washcloth on your face 3-4 times a day or as told by your doctor. This will help with discomfort.  Wash your hands often with soap and water. If there is no soap and water, use hand sanitizer.  Do not smoke. Avoid being around people who are smoking (secondhand smoke).  Keep all follow-up visits as told by your doctor. This is important. Contact a doctor if:  You have a fever.  Your symptoms get worse.  Your symptoms do not get better within 10 days. Get help right away if:  You have a very bad headache.  You cannot stop throwing up (vomiting).  You have pain or swelling around your face or eyes.  You have trouble seeing.  You feel confused.  Your neck is stiff.  You have trouble breathing. This  information is not intended to replace advice given to you by your health care provider. Make sure you discuss any questions you have with your health care provider. Document Released: 01/08/2008 Document Revised: 03/17/2016 Document Reviewed: 05/17/2015 Elsevier Interactive Patient Education  [employer].

## 2018-02-11 NOTE — Progress Notes (Signed)
Subjective: CC: URi PCP: Dettinger, Elige Radon, MD WNI:OEVOJ Isaac Scott is Isaac 27 y.o. male presenting to clinic today for:  1. URI Patient reports onset of sinus pressure, sinus discharge, nasal congestion, productive cough and chest congestion about 1 week ago.  He notes that symptoms have gradually gotten worse, particularly on the right side of the face.  He notes pain and intermittent dizziness related to symptoms.  Denies any hemoptysis, shortness of breath, wheezing or measured fevers but does note chills at home.  No nausea, vomiting, diarrhea.  No known sick contacts but he reports that his wife is Isaac Engineer, civil (consulting) and may have brought something home.  ROS: Per HPI  Allergies  Allergen Reactions  . Naproxen Hives   Past Medical History:  Diagnosis Date  . Generalized headaches   . Hemorrhoids    No current outpatient medications on file. Social History   Socioeconomic History  . Marital status: Married    Spouse name: Not on file  . Number of children: 1  . Years of education: Not on file  . Highest education level: Not on file  Occupational History  . Occupation: HVAC  Social Needs  . Financial resource strain: Not on file  . Food insecurity:    Worry: Not on file    Inability: Not on file  . Transportation needs:    Medical: Not on file    Non-medical: Not on file  Tobacco Use  . Smoking status: Never Smoker  . Smokeless tobacco: Current User    Types: Chew  Substance and Sexual Activity  . Alcohol use: No  . Drug use: No  . Sexual activity: Not on file  Lifestyle  . Physical activity:    Days per week: Not on file    Minutes per session: Not on file  . Stress: Not on file  Relationships  . Social connections:    Talks on phone: Not on file    Gets together: Not on file    Attends religious service: Not on file    Active member of club or organization: Not on file    Attends meetings of clubs or organizations: Not on file    Relationship status: Not on file    . Intimate partner violence:    Fear of current or ex partner: Not on file    Emotionally abused: Not on file    Physically abused: Not on file    Forced sexual activity: Not on file  Other Topics Concern  . Not on file  Social History Narrative   2 caffeine drinks daily    Family History  Problem Relation Age of Onset  . Coronary artery disease Maternal Grandmother   . Coronary artery disease Maternal Grandfather   . Colon cancer Neg Hx     Objective: Office vital signs reviewed. BP 123/79   Pulse 73   Temp 98.2 F (36.8 C) (Oral)   Ht 5\' 10"  (1.778 m)   Wt 239 lb (108.4 kg)   BMI 34.29 kg/m   Physical Examination:  General: Awake, alert, well nourished, No acute distress HEENT: no frontal sinus TTP. Minimal maxillary sinus TTP on right    Neck: No masses palpated.  Mild enlargement of the right anterior cervical lymph node    Ears: Tympanic membranes intact, normal light reflex, no erythema, no bulging    Eyes: PERRLA, extraocular membranes intact, sclera white    Nose: nasal turbinates moist, clear nasal discharge    Throat: moist  mucus membranes, mild oropharyngeal erythema, no tonsillar exudate.  Airway is patent Cardio: regular rate and rhythm, S1S2 heard, no murmurs appreciated Pulm: clear to auscultation bilaterally, no wheezes, rhonchi or rales; normal work of breathing on room air  Assessment/ Plan: 27 y.o. male   1. Acute bacterial sinusitis Patient is afebrile nontoxic-appearing.  Given persistent/worsening symptoms will empirically treat as acute bacterial sinusitis with Augmentin 875 p.o. twice daily for the next 10 days.  Tessalon Perles prescribed p.o. twice daily as needed cough.  Home care instructions reviewed and reasons for return discussed.  Follow-up as needed.    Meds ordered this encounter  Medications  . amoxicillin-clavulanate (AUGMENTIN) 875-125 MG tablet    Sig: Take 1 tablet by mouth 2 (two) times daily.    Dispense:  20 tablet     Refill:  0  . benzonatate (TESSALON) 200 MG capsule    Sig: Take 1 capsule (200 mg total) by mouth 2 (two) times daily as needed for cough.    Dispense:  20 capsule    Refill:  0     Eldena Dede Hulen Skains, DO [facility] [phone]

## 2018-03-15 DIAGNOSIS — R200 Anesthesia of skin: Secondary | ICD-10-CM | POA: Diagnosis not present

## 2018-03-15 DIAGNOSIS — R202 Paresthesia of skin: Secondary | ICD-10-CM | POA: Diagnosis not present

## 2018-03-15 DIAGNOSIS — R51 Headache: Secondary | ICD-10-CM | POA: Diagnosis not present

## 2018-03-15 DIAGNOSIS — R4781 Slurred speech: Secondary | ICD-10-CM | POA: Diagnosis not present

## 2018-03-15 DIAGNOSIS — R29818 Other symptoms and signs involving the nervous system: Secondary | ICD-10-CM | POA: Diagnosis not present

## 2018-03-16 ENCOUNTER — Telehealth: Payer: Self-pay | Admitting: Family Medicine

## 2018-03-16 ENCOUNTER — Telehealth: Payer: Self-pay

## 2018-03-16 ENCOUNTER — Other Ambulatory Visit: Payer: Self-pay | Admitting: Family Medicine

## 2018-03-16 DIAGNOSIS — S83412A Sprain of medial collateral ligament of left knee, initial encounter: Secondary | ICD-10-CM

## 2018-03-16 DIAGNOSIS — M25562 Pain in left knee: Secondary | ICD-10-CM

## 2018-03-16 DIAGNOSIS — R51 Headache: Secondary | ICD-10-CM | POA: Diagnosis not present

## 2018-03-16 NOTE — Telephone Encounter (Signed)
Wants a referral for MRI

## 2018-03-16 NOTE — Telephone Encounter (Signed)
Pt states he is having bilateral leg numbness appt scheduled Pt declined earlier appt

## 2018-03-16 NOTE — Progress Notes (Signed)
Left knee pain, continued, I am assuming this is what the patient is calling for MRI, there was some concern about MCL tear or strain.  I have placed the order for the MRI.

## 2018-03-16 NOTE — Progress Notes (Signed)
appt scheduled for evaluation Pt notified  

## 2018-03-17 ENCOUNTER — Ambulatory Visit: Payer: 59 | Admitting: Family Medicine

## 2018-03-17 ENCOUNTER — Encounter: Payer: Self-pay | Admitting: Family Medicine

## 2018-03-17 VITALS — BP 119/76 | HR 73 | Temp 98.4°F | Ht 70.0 in | Wt 232.0 lb

## 2018-03-17 DIAGNOSIS — R2990 Unspecified symptoms and signs involving the nervous system: Secondary | ICD-10-CM | POA: Diagnosis not present

## 2018-03-17 DIAGNOSIS — R208 Other disturbances of skin sensation: Secondary | ICD-10-CM

## 2018-03-17 DIAGNOSIS — R531 Weakness: Secondary | ICD-10-CM

## 2018-03-17 NOTE — Patient Instructions (Signed)
I have placed an urgent referral to neurology.  I have ordered several labs and will contact you soon as they start resulting.  MRI brain has been ordered.  You will be contacted for an appointment for neurology and an appointment to obtain an MRI within next couple of days.

## 2018-03-17 NOTE — Progress Notes (Signed)
Subjective: CC: bilateral feet numbness PCP: Dettinger, [name], MD VOH:Isaac Scott is a 27 y.o. male presenting to clinic today for:  1. Bilateral feet numbness Patient is accompanied to the office by his wife who notes that he has had a 6-day history of poor balance, lack of coordination, specifically right sided fine motor coordination.  He is right hand dominant.  They note associated difficulty with forming speech/mildly slurred speech.  He reports bilateral foot numbness as well as right arm and leg numbness.  This occurred spontaneously last Wednesday but wife did not find out about symptoms until Sunday.  He was seen at the [facility] emergency department on Sunday and had a CT scan performed.  Per the reports this was normal.  However, there was a very high suspicion for possible stroke and an MRI was recommended.  This was not obtained in the emergency department but recommendations were to seek care at another facility to obtain this.  Because symptoms have been ongoing, the wife decided that it would be best to wait and see if this could be obtained outpatient.  She follows up with her husband today to see if this might be pursued.  He was discharged on Plavix and aspirin.  She has noticed some slight improvement in the speech today.  Neurologic symptoms otherwise have remained stable.  No known family history of CVAs.  Father's history is fairly unknown.  There is a family history of cardiovascular disease.  Patient denies any IV drug use, chest pain, heart palpitations.  No preceding injury.  No known demyelinating disorders within the family.  Patient with known past medical history of spinal stenosis.  This was recently evaluated by patient's orthopedist, Dr. Lorin Mercy.  No surgical interventions were planned at this time.  Additionally, patient not a vegetarian. No known diabetes.   ROS: Per HPI  Allergies  Allergen Reactions  . Naproxen Hives   Past Medical History:    Diagnosis Date  . Generalized headaches   . Hemorrhoids     Current Outpatient Medications:  .  amoxicillin-clavulanate (AUGMENTIN) 875-125 MG tablet, Take 1 tablet by mouth 2 (two) times daily., Disp: 20 tablet, Rfl: 0 .  benzonatate (TESSALON) 200 MG capsule, Take 1 capsule (200 mg total) by mouth 2 (two) times daily as needed for cough., Disp: 20 capsule, Rfl: 0 Social History   Socioeconomic History  . Marital status: Married    Spouse name: Not on file  . Number of children: 1  . Years of education: Not on file  . Highest education level: Not on file  Occupational History  . Occupation: HVAC  Social Needs  . Financial resource strain: Not on file  . Food insecurity:    Worry: Not on file    Inability: Not on file  . Transportation needs:    Medical: Not on file    Non-medical: Not on file  Tobacco Use  . Smoking status: Never Smoker  . Smokeless tobacco: Current User    Types: Chew  Substance and Sexual Activity  . Alcohol use: No  . Drug use: No  . Sexual activity: Not on file  Lifestyle  . Physical activity:    Days per week: Not on file    Minutes per session: Not on file  . Stress: Not on file  Relationships  . Social connections:    Talks on phone: Not on file    Gets together: Not on file    Attends religious service:  Not on file    Active member of club or organization: Not on file    Attends meetings of clubs or organizations: Not on file    Relationship status: Not on file  . Intimate partner violence:    Fear of current or ex partner: Not on file    Emotionally abused: Not on file    Physically abused: Not on file    Forced sexual activity: Not on file  Other Topics Concern  . Not on file  Social History Narrative   2 caffeine drinks daily    Family History  Problem Relation Age of Onset  . Coronary artery disease Maternal Grandmother   . Coronary artery disease Maternal Grandfather   . Colon cancer Neg Hx     Objective: Office vital  signs reviewed. BP 119/76   Pulse 73   Temp 98.4 F (36.9 C)   Ht 5' 10"  (1.778 m)   Wt 232 lb (105.2 kg)   BMI 33.29 kg/m   Physical Examination:  General: Awake, alert, well nourished, nontoxic. No acute distress HEENT: Normal    Neck: No masses palpated. No lymphadenopathy    Ears: Tympanic membranes intact, normal light reflex, no erythema, no bulging    Eyes: PERRLA, extraocular membranes intact, sclera white    Nose: nasal turbinates moist, no nasal discharge    Throat: moist mucus membranes, no erythema, no tonsillar exudate.  Symmetric rise of palate noted.  Airway is patent Cardio: regular rate and rhythm, S1S2 heard, no murmurs appreciated Pulm: clear to auscultation bilaterally, no wheezes, rhonchi or rales; normal work of breathing on room air Extremities: warm, well perfused, No edema, cyanosis or clubbing; +2 pulses bilaterally MSK: normal gait and normal station Neuro: RUE Strength 4/5; LUE strength 5/5.  RUE with decreased light touch sensation.  Decreased vibratory sensation noted here as well.  Left upper extremity with vibratory sense and light touch sensation grossly intact, patient has decreased sensation along the V2 branch of the trigeminal nerve on the right.  He has decreased hearing on the right compared to the left.  Cranial nerves II through XII otherwise grossly intact.  He does have some difficulty with upper extremity cerebellar testing on the right.  He is able to perform the finger-to-nose test but seems to do so much more slowly compared to the left.  He does have some sway with Romberg.  He has decreased vibratory sense in bilateral feet.  Alert and oriented x3.  Assessment/ Plan: 27 y.o. male   1. Stroke-like symptom I reviewed his record from [facility] emergency department.  CAT scan of the head was unremarkable.  Labs essentially unremarkable as well except for a mildly elevated BUN.  His physical exam is very concerning for CVA.  Will obtain MRI  brain stat.  We will also place referral to neurology.  If CVA is found, this would warrant further work-up with echocardiogram, carotid Dopplers.  Differential diagnosis also includes demyelinating disorder.  Bilateral decreased sensation in the feet is somewhat unusual.  We will also check B12 level, Lyme titer.  He had normal TSH in 2018.  Could consider rechecking this and adding an A1c as well if after mentioned labs and imaging studies are unremarkable.  Do not think that deficiencies and thyroid or elevation of glucose would necessarily cause a unilateral neurologic deficit but may explain bilateral foot sensation changes. - MR Brain Wo Contrast; Future - Ambulatory referral to Neurology - CMP14+EGFR - CBC with Differential  2. Acute right-sided weakness - MR Brain Wo Contrast; Future - Ambulatory referral to Neurology  3. Decreased sensation - MR Brain Wo Contrast; Future - Ambulatory referral to Neurology - Vitamin B12 - Lyme Ab/Western Blot Reflex   Orders Placed This Encounter  Procedures  . MR Brain Wo Contrast    Standing Status:   Future    Standing Expiration Date:   05/18/2019    Order Specific Question:   ** REASON FOR EXAM (FREE TEXT)    Answer:   Right sided weakness/ decreased sensation/ changes in proprioception x1 week.    Order Specific Question:   What is the patient's sedation requirement?    Answer:   No Sedation    Order Specific Question:   Does the patient have a pacemaker or implanted devices?    Answer:   No    Order Specific Question:   Preferred imaging location?    Answer:   Internal    Order Specific Question:   Radiology Contrast Protocol - do NOT remove file path    Answer:   [email or web address]  . CMP14+EGFR  . CBC with Differential  . Vitamin B12  . Lyme Ab/Western Blot Reflex  . Ambulatory referral to Neurology    Referral Priority:   Urgent    Referral Type:   Consultation    Referral Reason:   Specialty Services  Required    Requested Specialty:   Neurology    Number of Visits Requested:   [place], [place] [phone]

## 2018-03-18 LAB — CBC WITH DIFFERENTIAL/PLATELET
BASOS ABS: 0 10*3/uL (ref 0.0–0.2)
Basos: 1 %
EOS (ABSOLUTE): 0.2 10*3/uL (ref 0.0–0.4)
Eos: 4 %
HEMATOCRIT: 46.5 % (ref 37.5–51.0)
Hemoglobin: 16 g/dL (ref 13.0–17.7)
Immature Grans (Abs): 0 10*3/uL (ref 0.0–0.1)
Immature Granulocytes: 0 %
LYMPHS ABS: 2 10*3/uL (ref 0.7–3.1)
Lymphs: 32 %
MCH: 30.8 pg (ref 26.6–33.0)
MCHC: 34.4 g/dL (ref 31.5–35.7)
MCV: 89 fL (ref 79–97)
Monocytes Absolute: 0.4 10*3/uL (ref 0.1–0.9)
Monocytes: 7 %
Neutrophils Absolute: 3.5 10*3/uL (ref 1.4–7.0)
Neutrophils: 56 %
Platelets: 205 10*3/uL (ref 150–450)
RBC: 5.2 x10E6/uL (ref 4.14–5.80)
RDW: 13.6 % (ref 12.3–15.4)
WBC: 6.2 10*3/uL (ref 3.4–10.8)

## 2018-03-18 LAB — CMP14+EGFR
A/G RATIO: 1.8 (ref 1.2–2.2)
ALBUMIN: 4.5 g/dL (ref 3.5–5.5)
ALK PHOS: 84 IU/L (ref 39–117)
ALT: 22 IU/L (ref 0–44)
AST: 19 IU/L (ref 0–40)
BILIRUBIN TOTAL: 0.4 mg/dL (ref 0.0–1.2)
BUN / CREAT RATIO: 19 (ref 9–20)
BUN: 14 mg/dL (ref 6–20)
CHLORIDE: 103 mmol/L (ref 96–106)
CO2: 21 mmol/L (ref 20–29)
Calcium: 9.2 mg/dL (ref 8.7–10.2)
Creatinine, Ser: 0.75 mg/dL — ABNORMAL LOW (ref 0.76–1.27)
GFR calc Af Amer: 145 mL/min/{1.73_m2} (ref >59)
GFR calc non Af Amer: 126 mL/min/{1.73_m2} (ref >59)
Globulin, Total: 2.5 g/dL (ref 1.5–4.5)
Glucose: 91 mg/dL (ref 65–99)
Potassium: 4.4 mmol/L (ref 3.5–5.2)
Sodium: 140 mmol/L (ref 134–144)
Total Protein: 7 g/dL (ref 6.0–8.5)

## 2018-03-18 LAB — LYME AB/WESTERN BLOT REFLEX: LYME DISEASE AB, QUANT, IGM: <0.8 index (ref 0.00–0.79)

## 2018-03-18 LAB — VITAMIN B12: VITAMIN B 12: 805 pg/mL (ref 232–1245)

## 2018-03-19 ENCOUNTER — Telehealth: Payer: Self-pay | Admitting: Family Medicine

## 2018-03-19 NOTE — Telephone Encounter (Signed)
Please advise 

## 2018-03-19 NOTE — Telephone Encounter (Signed)
Pt aware the MRI has gone to medical review and is waiting on Dr. Nadine Counts for a peer to peer review

## 2018-03-20 ENCOUNTER — Ambulatory Visit (HOSPITAL_COMMUNITY): Admission: RE | Admit: 2018-03-20 | Payer: 59 | Source: Ambulatory Visit

## 2018-03-30 ENCOUNTER — Ambulatory Visit (HOSPITAL_COMMUNITY)
Admission: RE | Admit: 2018-03-30 | Discharge: 2018-03-30 | Disposition: A | Discharge status: Home / Self Care | Payer: 59 | Source: Ambulatory Visit | Attending: Family Medicine | Admitting: Family Medicine

## 2018-03-30 DIAGNOSIS — R2990 Unspecified symptoms and signs involving the nervous system: Secondary | ICD-10-CM | POA: Diagnosis present

## 2018-03-30 DIAGNOSIS — G3189 Other specified degenerative diseases of nervous system: Secondary | ICD-10-CM | POA: Diagnosis not present

## 2018-03-30 DIAGNOSIS — R208 Other disturbances of skin sensation: Secondary | ICD-10-CM | POA: Diagnosis not present

## 2018-03-30 DIAGNOSIS — R531 Weakness: Secondary | ICD-10-CM | POA: Diagnosis not present

## 2018-04-01 ENCOUNTER — Other Ambulatory Visit: Payer: Self-pay | Admitting: Neurology

## 2018-04-01 DIAGNOSIS — G35 Multiple sclerosis: Secondary | ICD-10-CM

## 2018-04-01 DIAGNOSIS — G8191 Hemiplegia, unspecified affecting right dominant side: Secondary | ICD-10-CM | POA: Diagnosis not present

## 2018-04-01 DIAGNOSIS — R471 Dysarthria and anarthria: Secondary | ICD-10-CM | POA: Diagnosis not present

## 2018-04-07 ENCOUNTER — Encounter (HOSPITAL_COMMUNITY): Admission: RE | Admit: 2018-04-07 | Payer: 59 | Source: Ambulatory Visit

## 2018-04-08 ENCOUNTER — Ambulatory Visit (HOSPITAL_COMMUNITY)
Admission: RE | Admit: 2018-04-08 | Discharge: 2018-04-08 | Disposition: A | Discharge status: Home / Self Care | Payer: 59 | Source: Ambulatory Visit | Attending: Neurology | Admitting: Neurology

## 2018-04-08 ENCOUNTER — Encounter (HOSPITAL_COMMUNITY): Payer: Self-pay

## 2018-04-08 ENCOUNTER — Encounter (HOSPITAL_COMMUNITY)
Admission: RE | Admit: 2018-04-08 | Discharge: 2018-04-08 | Disposition: A | Discharge status: Home / Self Care | Payer: 59 | Source: Ambulatory Visit | Attending: Neurology | Admitting: Neurology

## 2018-04-08 ENCOUNTER — Encounter (HOSPITAL_COMMUNITY): Payer: 59

## 2018-04-08 DIAGNOSIS — M542 Cervicalgia: Secondary | ICD-10-CM | POA: Diagnosis not present

## 2018-04-08 DIAGNOSIS — G35 Multiple sclerosis: Secondary | ICD-10-CM

## 2018-04-08 HISTORY — DX: Multiple sclerosis: G35

## 2018-04-08 MED ORDER — GADOBENATE DIMEGLUMINE 529 MG/ML IV SOLN
20.0000 mL | Freq: Once | INTRAVENOUS | Status: AC | PRN
  Administered 2018-04-08: 20 mL via INTRAVENOUS

## 2018-04-08 MED ORDER — SODIUM CHLORIDE 0.9 % IV SOLN
Freq: Once | INTRAVENOUS | Status: AC
  Administered 2018-04-08: 11:00:00 via INTRAVENOUS

## 2018-04-08 MED ORDER — SODIUM CHLORIDE 0.9 % IV SOLN
1000.0000 mg | Freq: Once | INTRAVENOUS | Status: AC
  Administered 2018-04-08: 1000 mg via INTRAVENOUS
  Filled 2018-04-08: qty 8

## 2018-04-09 ENCOUNTER — Encounter (HOSPITAL_COMMUNITY): Payer: Self-pay

## 2018-04-09 ENCOUNTER — Encounter (HOSPITAL_COMMUNITY)
Admission: RE | Admit: 2018-04-09 | Discharge: 2018-04-09 | Disposition: A | Discharge status: Home / Self Care | Payer: 59 | Source: Ambulatory Visit | Attending: Neurology | Admitting: Neurology

## 2018-04-09 DIAGNOSIS — G35 Multiple sclerosis: Secondary | ICD-10-CM | POA: Diagnosis not present

## 2018-04-09 MED ORDER — SODIUM CHLORIDE 0.9 % IV SOLN
Freq: Once | INTRAVENOUS | Status: AC
  Administered 2018-04-09: 09:00:00 via INTRAVENOUS

## 2018-04-09 MED ORDER — SODIUM CHLORIDE 0.9% FLUSH
10.0000 mL | INTRAVENOUS | Status: DC
  Administered 2018-04-09: 10 mL via INTRAVENOUS

## 2018-04-09 MED ORDER — SODIUM CHLORIDE 0.9 % IV SOLN
1000.0000 mg | Freq: Once | INTRAVENOUS | Status: AC
  Administered 2018-04-09: 1000 mg via INTRAVENOUS
  Filled 2018-04-09: qty 8

## 2018-04-10 ENCOUNTER — Encounter (HOSPITAL_COMMUNITY)
Admission: RE | Admit: 2018-04-10 | Discharge: 2018-04-10 | Disposition: A | Discharge status: Home / Self Care | Payer: 59 | Source: Ambulatory Visit | Attending: Neurology | Admitting: Neurology

## 2018-04-10 DIAGNOSIS — G35 Multiple sclerosis: Secondary | ICD-10-CM | POA: Diagnosis not present

## 2018-04-10 MED ORDER — SODIUM CHLORIDE 0.9 % IV SOLN
1000.0000 mg | Freq: Once | INTRAVENOUS | Status: AC
  Administered 2018-04-10: 1000 mg via INTRAVENOUS
  Filled 2018-04-10: qty 8

## 2018-04-10 MED ORDER — SODIUM CHLORIDE 0.9 % IV SOLN
Freq: Once | INTRAVENOUS | Status: AC
  Administered 2018-04-10: 250 mL via INTRAVENOUS

## 2018-04-28 ENCOUNTER — Telehealth: Payer: Self-pay | Admitting: Family Medicine

## 2018-04-28 NOTE — Telephone Encounter (Signed)
Calling patient to touch base with regards to a request for medical records form that was received from [facility].  Calling to make sure that this is a legitimate request for information.  Please ask patient if he is requesting a second opinion from [facility] in [place], [place], as this is what the request for medical records suggests.  Ashly M. Nadine Counts, DO [facility]

## 2018-04-29 NOTE — Telephone Encounter (Signed)
Yes I agree with Dr. Nadine Counts, go ahead and call the patient and asked them if they are going to see this Dr. in [facility] and if they are we can go ahead and complete the request

## 2018-04-29 NOTE — Telephone Encounter (Signed)
lmtcb

## 2018-04-30 ENCOUNTER — Telehealth: Payer: Self-pay | Admitting: Family Medicine

## 2018-04-30 NOTE — Telephone Encounter (Signed)
Found paper and it is getting worked on to send his records.

## 2018-04-30 NOTE — Telephone Encounter (Signed)
If patient call's back please ask if the requestt information is correct. Attempted to contact patient and lmtcb. Just needing to know if request form is correct

## 2018-04-30 NOTE — Telephone Encounter (Signed)
Spoke with patient and he states that he does want his records to be sent to [facility] - Do you have the form?

## 2018-04-30 NOTE — Telephone Encounter (Signed)
lmtcb

## 2018-04-30 NOTE — Telephone Encounter (Signed)
It was put in my outgoing basket yesterday. Maybe in the scan pile?

## 2018-04-30 NOTE — Telephone Encounter (Signed)
RC about note

## 2018-04-30 NOTE — Telephone Encounter (Signed)
DUP NOTE

## 2018-05-21 ENCOUNTER — Encounter (HOSPITAL_COMMUNITY)
Admission: RE | Admit: 2018-05-21 | Discharge: 2018-05-21 | Disposition: A | Discharge status: Home / Self Care | Payer: 59 | Source: Ambulatory Visit | Attending: Neurology | Admitting: Neurology

## 2018-05-21 ENCOUNTER — Telehealth: Payer: Self-pay | Admitting: Family Medicine

## 2018-05-21 ENCOUNTER — Encounter (HOSPITAL_COMMUNITY): Payer: Self-pay

## 2018-05-21 DIAGNOSIS — G35 Multiple sclerosis: Secondary | ICD-10-CM | POA: Diagnosis present

## 2018-05-21 MED ORDER — SODIUM CHLORIDE 0.9 % IV SOLN
300.0000 mg | INTRAVENOUS | Status: DC
  Administered 2018-05-21: 300 mg via INTRAVENOUS
  Filled 2018-05-21: qty 15

## 2018-05-21 MED ORDER — SODIUM CHLORIDE 0.9 % IV SOLN
INTRAVENOUS | Status: DC
  Administered 2018-05-21: 08:00:00 via INTRAVENOUS

## 2018-05-21 NOTE — Telephone Encounter (Signed)
Patient states he was DX with MS from [facility] in Aug. Patient works for [employer] and would like to use their gym and work states he needs a Chiropractor not stating that he can still go to the gym with his DX of MS.  Advise patient he needed to get his note from [facility] since we have note seen him since new DX. Patient states he would like PCP to write letter. It is okay to write letter or does he NTBS?

## 2018-05-21 NOTE — Telephone Encounter (Signed)
Yes I am fine to do a note saying that he is okay to go use the gym, I just do not want him using it without anybody else there, he should always have a lifting partner

## 2018-05-21 NOTE — Telephone Encounter (Signed)
lmtcb

## 2018-05-28 NOTE — Telephone Encounter (Signed)
Left message, letter is ready.

## 2018-05-28 NOTE — Telephone Encounter (Signed)
Letter ready for pick up

## 2018-06-18 ENCOUNTER — Encounter (HOSPITAL_COMMUNITY)
Admission: RE | Admit: 2018-06-18 | Discharge: 2018-06-18 | Disposition: A | Discharge status: Home / Self Care | Payer: 59 | Source: Ambulatory Visit | Attending: Neurology | Admitting: Neurology

## 2018-06-18 ENCOUNTER — Encounter (HOSPITAL_COMMUNITY): Payer: 59

## 2018-06-18 ENCOUNTER — Encounter (HOSPITAL_COMMUNITY): Payer: Self-pay

## 2018-06-18 DIAGNOSIS — G35 Multiple sclerosis: Secondary | ICD-10-CM | POA: Insufficient documentation

## 2018-06-18 MED ORDER — SODIUM CHLORIDE 0.9 % IV SOLN
300.0000 mg | Freq: Once | INTRAVENOUS | Status: AC
  Administered 2018-06-18: 300 mg via INTRAVENOUS
  Filled 2018-06-18: qty 15

## 2018-06-18 MED ORDER — SODIUM CHLORIDE 0.9 % IV SOLN
INTRAVENOUS | Status: DC
  Administered 2018-06-18: 14:00:00 via INTRAVENOUS

## 2018-07-20 ENCOUNTER — Encounter (HOSPITAL_COMMUNITY)
Admission: RE | Admit: 2018-07-20 | Discharge: 2018-07-20 | Disposition: A | Discharge status: Home / Self Care | Payer: 59 | Source: Ambulatory Visit | Attending: Neurology | Admitting: Neurology

## 2018-07-20 ENCOUNTER — Encounter (HOSPITAL_COMMUNITY): Payer: 59

## 2018-07-20 DIAGNOSIS — G35 Multiple sclerosis: Secondary | ICD-10-CM | POA: Insufficient documentation

## 2018-07-20 MED ORDER — SODIUM CHLORIDE 0.9 % IV SOLN
300.0000 mg | Freq: Once | INTRAVENOUS | Status: AC
  Administered 2018-07-20: 300 mg via INTRAVENOUS
  Filled 2018-07-20: qty 15

## 2018-07-20 MED ORDER — SODIUM CHLORIDE 0.9 % IV SOLN
Freq: Once | INTRAVENOUS | Status: AC
  Administered 2018-07-20: 250 mL via INTRAVENOUS

## 2018-07-23 ENCOUNTER — Ambulatory Visit (HOSPITAL_COMMUNITY)
Admission: RE | Admit: 2018-07-23 | Discharge: 2018-07-23 | Disposition: A | Discharge status: Home / Self Care | Payer: 59 | Attending: Psychiatry | Admitting: Psychiatry

## 2018-07-23 DIAGNOSIS — G35 Multiple sclerosis: Secondary | ICD-10-CM | POA: Insufficient documentation

## 2018-07-23 DIAGNOSIS — F321 Major depressive disorder, single episode, moderate: Secondary | ICD-10-CM | POA: Diagnosis present

## 2018-07-23 NOTE — BH Assessment (Addendum)
Assessment Note  Isaac Scott is a 27 y.o. male who came to [facility] due to ongoing depressive symptoms he has been experiencing. Pt shares he and his wife separated three years ago; he states they got back together two years ago, but as of recent the behaviors they engaged in while they were separated have been emerging and it's been hurtful to him. Pt states he had been on an anti-depressant in the past, but that he went off of it when he was unable to get it re-filled. Pt states he believes the medication did work and that he could again benefit from getting back on it.  Pt shares he has been having passive SI; he states he'll think about the effect it would have on his children and states that he could never do that to his children. Pt denies HI, VH, and NSSIB. He states he was in bed one night and he's unsure if he was asleep or not, but he believes he heard someone say either "Finisher" or "Finish her." Pt states this has been very unnerving.   Pt denies any SA or access to weapons. He shares he has no engagement in the legal system. Pt states there is no family history of SI nor of SA. He states his mother, brother, and sister all have a history with depression.  Pt shares he had been experiencing tingling in his feet and that one of his shoulders became stiff, so he went to the doctor and was seen. Pt states that, after an MRI, he was informed that he has MS. Pt states this has taken some adjusting to and that the doctor who diagnosed him stated at the time that most people who had as much "damage" would already be in a wheelchair.  Pt is oriented x4. His recent and remote memory is intact. Pt was cooperative and pleasant throughout the assessment. Pt's insight, judgement, and impulse control is good at this time.   Diagnosis: F32.1, Major depressive disorder, Single episode, Moderate   Past Medical History:  Past Medical History:  Diagnosis Date  . Generalized headaches   .  Hemorrhoids   . Multiple sclerosis (HCC)     Past Surgical History:  Procedure Laterality Date  . TOTAL HIP ARTHROPLASTY     Left     Family History:  Family History  Problem Relation Age of Onset  . Coronary artery disease Maternal Grandmother   . Coronary artery disease Maternal Grandfather   . Colon cancer Neg Hx     Social History:  reports that he has never smoked. His smokeless tobacco use includes chew. He reports that he does not drink alcohol or use drugs.  Additional Social History:  Alcohol / Drug Use Pain Medications: Please see MAR Prescriptions: Please see MAR Over the Counter: Please see MAR History of alcohol / drug use?: No history of alcohol / drug abuse Longest period of sobriety (when/how long): N/A  CIWA:   COWS:    Allergies:  Allergies  Allergen Reactions  . Naproxen Hives    Home Medications: (Not in a hospital admission)   OB/GYN Status:  No LMP for male patient.  General Assessment Data Location of Assessment: [facility] TTS Assessment: In system Is this a Tele or Face-to-Face Assessment?: Face-to-Face Is this an Initial Assessment or a Re-assessment for this encounter?: Initial Assessment Patient Accompanied by:: N/A Language Other than English: No Living Arrangements: Other (Comment)(Pt lives with his wife and their two children)  What gender do you identify as?: Male Marital status: Married Jordan name: Thune Pregnancy Status: No Living Arrangements: Children, Spouse/significant other Can pt return to current living arrangement?: Yes Admission Status: Voluntary Is patient capable of signing voluntary admission?: Yes Referral Source: Self/Family/Friend Insurance type: Research officer, trade union Exam [facility] Walk-in ONLY) Medical Exam completed: Yes  Crisis Care Plan Living Arrangements: Children, Spouse/significant other Legal Guardian: (N/A) Name of Psychiatrist: None Name of Therapist:  None  Education Status Is patient currently in school?: No Is the patient employed, unemployed or receiving disability?: Employed  Risk to self with the past 6 months Suicidal Ideation: Yes-Currently Present Has patient been a risk to self within the past 6 months prior to admission? : Yes Suicidal Intent: No-Not Currently/Within Last 6 Months Has patient had any suicidal intent within the past 6 months prior to admission? : No Is patient at risk for suicide?: No Suicidal Plan?: No-Not Currently/Within Last 6 Months Has patient had any suicidal plan within the past 6 months prior to admission? : No Access to Means: No What has been your use of drugs/alcohol within the last 12 months?: Pt denies Previous Attempts/Gestures: No How many times?: 0 Other Self Harm Risks: None noted Triggers for Past Attempts: Spouse contact Intentional Self Injurious Behavior: None Family Suicide History: No Recent stressful life event(s): Conflict (Comment), Recent negative physical changes(Pt & partner have been arguing, MS diagnosis) Persecutory voices/beliefs?: No Depression: Yes Depression Symptoms: Guilt, Feeling worthless/self pity, Feeling angry/irritable Substance abuse history and/or treatment for substance abuse?: No Suicide prevention information given to non-admitted patients: Not applicable  Risk to Others within the past 6 months Homicidal Ideation: No Does patient have any lifetime risk of violence toward others beyond the six months prior to admission? : No Thoughts of Harm to Others: No Current Homicidal Intent: No Current Homicidal Plan: No Access to Homicidal Means: No Identified Victim: None noted History of harm to others?: No Assessment of Violence: On admission Violent Behavior Description: None noted Does patient have access to weapons?: No(Pt denied) Criminal Charges Pending?: No Does patient have a court date: No Is patient on probation?:  No  Psychosis Hallucinations: Auditory(Pt heard someone in the night, not sure if was a dream) Delusions: None noted  Mental Status Report Appearance/Hygiene: Unremarkable Eye Contact: Good Motor Activity: Unremarkable Speech: Logical/coherent Level of Consciousness: Alert Mood: Depressed Affect: Depressed Anxiety Level: Moderate Thought Processes: Coherent, Relevant Judgement: Partial Orientation: Person, Place, Time, Situation Obsessive Compulsive Thoughts/Behaviors: Minimal  Cognitive Functioning Concentration: Good Memory: Recent Intact, Remote Intact Is patient IDD: No Insight: Fair Impulse Control: Fair Appetite: Good Have you had any weight changes? : No Change Sleep: No Change Total Hours of Sleep: (Pt works shift work so doesn't sleep typical hours) Vegetative Symptoms: None  ADLScreening [facility]) Patient's cognitive ability adequate to safely complete daily activities?: Yes Patient able to express need for assistance with ADLs?: Yes Independently performs ADLs?: Yes (appropriate for developmental age)  Prior Inpatient Therapy Prior Inpatient Therapy: No  Prior Outpatient Therapy Prior Outpatient Therapy: No Does patient have an ACCT team?: No Does patient have Intensive In-House Services?  : No Does patient have [facility] services? : No Does patient have P4CC services?: No  ADL Screening (condition at time of admission) Patient's cognitive ability adequate to safely complete daily activities?: Yes Is the patient deaf or have difficulty hearing?: No Does the patient have difficulty seeing, even when wearing glasses/contacts?: No Does the patient have difficulty concentrating, remembering, or  making decisions?: No Patient able to express need for assistance with ADLs?: Yes Does the patient have difficulty dressing or bathing?: No Independently performs ADLs?: Yes (appropriate for developmental age) Does the patient have difficulty walking  or climbing stairs?: No Weakness of Legs: None Weakness of Arms/Hands: None     Therapy Consults (therapy consults require a physician order) PT Evaluation Needed: No OT Evalulation Needed: No SLP Evaluation Needed: No Abuse/Neglect Assessment (Assessment to be complete while patient is alone) Abuse/Neglect Assessment Can Be Completed: Yes Physical Abuse: Denies Verbal Abuse: Denies Sexual Abuse: Denies Exploitation of patient/patient's resources: Denies Self-Neglect: Denies Values / Beliefs Cultural Requests During Hospitalization: None Spiritual Requests During Hospitalization: None Consults Spiritual Care Consult Needed: No Social Work Consult Needed: No Merchant navy officerAdvance Directives (For Healthcare) Does Patient Have a Medical Advance Directive?: No Would patient like information on creating a medical advance directive?: No - Patient declined       Disposition: Donell SievertSpencer Simon PA reviewed pt's chart and information and determined that pt does not meet criteria for inpatient hospitalization. Pt was provided lists of information of different providers for therapy and medication management that are within the area; the information also included information about Crisis Response Team. Disposition Initial Assessment Completed for this Encounter: Yes Patient referred to: Other (Comment)(Provided two lists of resources & contact info for crisis)  On Site Evaluation by:   Reviewed with Physician:    Ralph DowdySamantha L Miner Koral 07/23/2018 3:22 AM

## 2018-07-23 NOTE — H&P (Signed)
Behavioral Health Medical Screening Exam  Isaac Scott is an 27 y.o. male.presenting via POV, requesting resources due to MDD.recently diagnosed with MS, dealing with health changes. He is denying SI/SA or HI.  Total Time spent with patient: 15 minutes  Psychiatric Specialty Exam: Physical Exam  Constitutional: He is oriented to person, place, and time. He appears well-developed and well-nourished. No distress.  HENT:  Head: Normocephalic.  Eyes: Pupils are equal, round, and reactive to light.  Respiratory: Effort normal and breath sounds normal.  Neurological: He is alert and oriented to person, place, and time. No cranial nerve deficit.  Skin: Skin is warm and dry. He is not diaphoretic.  Psychiatric: His speech is normal. Judgment and thought content normal. He is withdrawn. Cognition and memory are normal. He exhibits a depressed mood. He expresses no homicidal and no suicidal ideation. He expresses no suicidal plans and no homicidal plans.    Review of Systems  Constitutional: Negative for chills, diaphoresis, fever, malaise/fatigue and weight loss.  Neurological: Positive for sensory change and weakness.  Psychiatric/Behavioral: Positive for depression. Negative for suicidal ideas. The patient is not nervous/anxious.     There were no vitals taken for this visit.There is no height or weight on file to calculate BMI.  General Appearance: Casual  Eye Contact:  Good  Speech:  Clear and Coherent  Volume:  Normal  Mood:  Depressed  Affect:  Congruent  Thought Process:  Goal Directed  Orientation:  Full (Time, Place, and Person)  Thought Content:  Logical  Suicidal Thoughts:  No  Homicidal Thoughts:  No  Memory:  NA Immediate;   Fair  Judgement:  Good  Insight:  Good  Psychomotor Activity:  Normal  Concentration: Concentration: Good  Recall:  Good  Fund of Knowledge:Good  Language: Good  Akathisia:  No  Handed:  Right  AIMS (if indicated):     Assets:  Desire for  Improvement  Sleep:       Musculoskeletal: Strength & Muscle Tone: within normal limits Gait & Station: normal Patient leans: N/A  There were no vitals taken for this visit.  Recommendations:  Based on my evaluation the patient does not appear to have an emergency medical condition.  Kerry Hough, PA-C 07/23/2018, 3:07 AM

## 2018-08-17 ENCOUNTER — Encounter (HOSPITAL_COMMUNITY)
Admission: RE | Admit: 2018-08-17 | Discharge: 2018-08-17 | Disposition: A | Discharge status: Home / Self Care | Payer: 59 | Source: Ambulatory Visit | Attending: Neurology | Admitting: Neurology

## 2018-08-17 ENCOUNTER — Encounter (HOSPITAL_COMMUNITY): Payer: Self-pay

## 2018-08-17 DIAGNOSIS — G35 Multiple sclerosis: Secondary | ICD-10-CM | POA: Insufficient documentation

## 2018-08-17 MED ORDER — SODIUM CHLORIDE 0.9 % IV SOLN
300.0000 mg | Freq: Once | INTRAVENOUS | Status: AC
  Administered 2018-08-17: 300 mg via INTRAVENOUS
  Filled 2018-08-17: qty 15

## 2018-08-17 MED ORDER — SODIUM CHLORIDE 0.9 % IV SOLN
Freq: Once | INTRAVENOUS | Status: AC
  Administered 2018-08-17: 09:00:00 via INTRAVENOUS

## 2018-08-20 ENCOUNTER — Encounter (HOSPITAL_COMMUNITY): Payer: 59

## 2018-09-02 ENCOUNTER — Encounter: Payer: Self-pay | Admitting: Family Medicine

## 2018-09-02 ENCOUNTER — Ambulatory Visit (INDEPENDENT_AMBULATORY_CARE_PROVIDER_SITE_OTHER): Payer: 59 | Admitting: Family Medicine

## 2018-09-02 VITALS — BP 125/80 | HR 72 | Temp 97.3°F | Ht 70.0 in | Wt 236.2 lb

## 2018-09-02 DIAGNOSIS — G35 Multiple sclerosis: Secondary | ICD-10-CM | POA: Diagnosis not present

## 2018-09-02 DIAGNOSIS — F339 Major depressive disorder, recurrent, unspecified: Secondary | ICD-10-CM | POA: Diagnosis not present

## 2018-09-02 MED ORDER — HYDROXYZINE HCL 50 MG PO TABS: 50.0000 mg | ORAL_TABLET | Freq: Three times a day (TID) | ORAL | 1 refills | Status: AC | PRN

## 2018-09-02 MED ORDER — SERTRALINE HCL 50 MG PO TABS: 50.0000 mg | ORAL_TABLET | Freq: Every day | ORAL | 1 refills | Status: DC

## 2018-09-02 NOTE — Progress Notes (Signed)
BP 125/80   Pulse 72   Temp (!) 97.3 F (36.3 C) (Oral)   Ht 5\' 10"  (1.778 m)   Wt 236 lb 3.2 oz (107.1 kg)   BMI 33.89 kg/m    Subjective:    Patient ID: Isaac Scott, male    DOB: [birth year], 28 y.o.   MRN: [id number]  HPI: Isaac Scott is a 28 y.o. male presenting on 09/02/2018 for Depression (Patient states it has been going on for months and just found out he has MS.) and Anxiety   HPI Patient is coming in for depression.  He says that his depression has come back full force now especially with dealing with the new diagnosis of MS and a follow-up from that.  He says he has been feeling down and depressed and anxious.  He says is gotten to the point where he is had some thoughts of harming himself or committing suicide although he denies any action plan.  He says he has not had any of those thoughts for at least a couple weeks now.  He says he did call the crisis line and it did not work out well for him.  He says his anxiety is built up to him it is controlling him a lot of ways.  He is coming in because he wants to get back on medication.  Previously had tried Effexor but he did not do the greatest on it or the worst on it but he would like to try something different.  Patient has been diagnosed with MS and has been seen by Dr. Gerilyn Pilgrim but does want to see an MS specialist if possible.  Relevant past medical, surgical, family and social history reviewed and updated as indicated. Interim medical history since our last visit reviewed. Allergies and medications reviewed and updated.  Review of Systems  Constitutional: Negative for chills and fever.  Eyes: Negative for visual disturbance.  Respiratory: Negative for shortness of breath and wheezing.   Cardiovascular: Negative for chest pain and leg swelling.  Musculoskeletal: Negative for back pain and gait problem.  Skin: Negative for rash.  Psychiatric/Behavioral: Positive for dysphoric mood. Negative for self-injury, sleep  disturbance and suicidal ideas. The patient is nervous/anxious.   All other systems reviewed and are negative.   Per HPI unless specifically indicated above   Allergies as of 09/02/2018      Reactions   Naproxen Hives      Medication List       Accurate as of September 02, 2018  8:28 AM. Always use your most recent med list.        hydrOXYzine 50 MG tablet Commonly known as:  ATARAX/VISTARIL Take 1 tablet (50 mg total) by mouth 3 (three) times daily as needed for anxiety.   sertraline 50 MG tablet Commonly known as:  ZOLOFT Take 1 tablet (50 mg total) by mouth daily.   TYSABRI IV Inject into the vein.          Objective:    BP 125/80   Pulse 72   Temp (!) 97.3 F (36.3 C) (Oral)   Ht 5\' 10"  (1.778 m)   Wt 236 lb 3.2 oz (107.1 kg)   BMI 33.89 kg/m   Wt Readings from Last 3 Encounters:  09/02/18 236 lb 3.2 oz (107.1 kg)  06/18/18 231 lb 7.7 oz (105 kg)  04/08/18 231 lb 14.8 oz (105.2 kg)    Physical Exam Vitals signs and nursing note reviewed.  Constitutional:  General: He is not in acute distress.    Appearance: He is well-developed. He is not diaphoretic.  Eyes:     General: No scleral icterus.    Conjunctiva/sclera: Conjunctivae normal.  Neck:     Musculoskeletal: Neck supple.     Thyroid: No thyromegaly.  Cardiovascular:     Rate and Rhythm: Normal rate and regular rhythm.     Heart sounds: Normal heart sounds. No murmur.  Pulmonary:     Effort: Pulmonary effort is normal. No respiratory distress.     Breath sounds: Normal breath sounds. No wheezing.  Musculoskeletal: Normal range of motion.  Lymphadenopathy:     Cervical: No cervical adenopathy.  Skin:    General: Skin is warm and dry.     Findings: No rash.  Neurological:     Mental Status: He is alert and oriented to person, place, and time.     Coordination: Coordination normal.  Psychiatric:        Mood and Affect: Mood is anxious and depressed.        Behavior: Behavior normal.          Thought Content: Thought content does not include suicidal ideation. Thought content does not include suicidal plan.         Assessment & Plan:   Problem List Items Addressed This Visit      Nervous and Auditory   Multiple sclerosis (HCC)   Relevant Medications   Natalizumab (TYSABRI IV)   Other Relevant Orders   Ambulatory referral to Neurology     Other   Depression, recurrent (HCC) - Primary   Relevant Medications   hydrOXYzine (ATARAX/VISTARIL) 50 MG tablet   sertraline (ZOLOFT) 50 MG tablet      Patient denies suicidal ideations today, gave crisis line in our phone number for office and instructed him to call if he gets to a really bad point.  We will try Zoloft with hydroxyzine or Vistaril for breakthrough. Follow up plan: Return in about 4 weeks (around 09/30/2018), or if symptoms worsen or fail to improve, for Depression recheck.  Counseling provided for all of the vaccine components Orders Placed This Encounter  Procedures  . Ambulatory referral to Neurology    Arville Care, MD [facility] 09/02/2018, 8:28 AM

## 2018-09-14 ENCOUNTER — Encounter (HOSPITAL_COMMUNITY): Payer: Self-pay

## 2018-09-14 ENCOUNTER — Encounter (HOSPITAL_COMMUNITY)
Admission: RE | Admit: 2018-09-14 | Discharge: 2018-09-14 | Disposition: A | Discharge status: Home / Self Care | Payer: 59 | Source: Ambulatory Visit | Attending: Neurology | Admitting: Neurology

## 2018-09-14 DIAGNOSIS — G35 Multiple sclerosis: Secondary | ICD-10-CM | POA: Insufficient documentation

## 2018-09-14 MED ORDER — SODIUM CHLORIDE 0.9 % IV SOLN
Freq: Once | INTRAVENOUS | Status: AC
  Administered 2018-09-14: 09:00:00 via INTRAVENOUS

## 2018-09-14 MED ORDER — SODIUM CHLORIDE 0.9 % IV SOLN
300.0000 mg | Freq: Once | INTRAVENOUS | Status: AC
  Administered 2018-09-14: 300 mg via INTRAVENOUS
  Filled 2018-09-14: qty 15

## 2018-09-17 ENCOUNTER — Encounter (HOSPITAL_COMMUNITY): Payer: 59

## 2018-09-21 ENCOUNTER — Ambulatory Visit: Payer: 59 | Admitting: Neurology

## 2018-09-21 ENCOUNTER — Telehealth: Payer: Self-pay | Admitting: *Deleted

## 2018-09-21 ENCOUNTER — Encounter: Payer: Self-pay | Admitting: Neurology

## 2018-09-21 ENCOUNTER — Other Ambulatory Visit: Payer: Self-pay

## 2018-09-21 VITALS — BP 132/77 | HR 76 | Ht 71.0 in | Wt 235.5 lb

## 2018-09-21 DIAGNOSIS — F339 Major depressive disorder, recurrent, unspecified: Secondary | ICD-10-CM | POA: Diagnosis not present

## 2018-09-21 DIAGNOSIS — G35 Multiple sclerosis: Secondary | ICD-10-CM

## 2018-09-21 DIAGNOSIS — Z79899 Other long term (current) drug therapy: Secondary | ICD-10-CM

## 2018-09-21 DIAGNOSIS — F419 Anxiety disorder, unspecified: Secondary | ICD-10-CM | POA: Diagnosis not present

## 2018-09-21 DIAGNOSIS — M25552 Pain in left hip: Secondary | ICD-10-CM

## 2018-09-21 DIAGNOSIS — G4726 Circadian rhythm sleep disorder, shift work type: Secondary | ICD-10-CM | POA: Insufficient documentation

## 2018-09-21 MED ORDER — SERTRALINE HCL 100 MG PO TABS: 100.0000 mg | ORAL_TABLET | Freq: Every day | ORAL | 5 refills | Status: DC

## 2018-09-21 NOTE — Progress Notes (Signed)
[facility]  PATIENT: Isaac Scott DOB: 11-Feb-1991  REFERRING DOCTOR OR PCP:  Arville Care, MD SOURCE: patient, notes from Dr. Louanne Skye, MRI and lab reports, MRI images on PACS.   _________________________________   HISTORICAL  CHIEF COMPLAINT:  Chief Complaint  Patient presents with  . New Patient (Initial Visit)    RM 13, alone. Internal referral for MS. No sx currently. When he was first dx he noticed right sided numbness from feet up the right side, slurred speech, decreased coordination. Ambulation issues. Went to [facility] thinking it was a stroke at first. But ruled this out. Had MRI's done. Can be viewed in EPIC.  . Multiple Sclerosis    Getting Tysabri infusions at [facility] right now. Last infusion: 09/14/18. Dx around last September. Has seen Dr. Gerilyn Pilgrim but wants to transfer care. No family hx of MS that he knows of. I cannot find recent JCV ab in EPIC or care everywhere. Needs lab completed it appears.    HISTORY OF PRESENT ILLNESS:  I had the pleasure of seeing your patient, Isaac Scott, at the MS center at [facility] for neurologic consultation regarding his multiple sclerosis.  He is a 28 yo man who was diagnosed with MS 04/2018.    In August 2019, he had numbness in his feet and then the numbness progressed up the right side.   Right hand coordination was reduced and voice was mildly slurred.    He initially had a CT scan which was normal.  An MRI, however, showed multiple lesions consistent with MS.   He then was referred to Dr. Gerilyn Pilgrim.   He had an MRI of the cervical spine and contrasted brain showing 6 enhancing lesions in the hemispheres and multiple lesions in the cervical spine (no cervical spine lesion enhanced).    He was infused with IV steroids x 3 days and noted improvement in symptoms by a week or two.    He was placed on Tysabri (has had 4-5 infusions) and tolerates it well.   Infusions are at [facility]  and his last infusion was 09/14/18.   He has not had any new symptoms since starting Tysabri.  He notes that his fatigue feels better for about 3 weeks out of each cycle.  Currently, he feels gait, strength, sensation and coordination are back to baseline.   Vision is normal.   He never had optic neuritis symptoms or diplopia.     Fatigue is poor, physical > cognitive.   He feels apathetic as well.    He has had some issues with depression and anxiety.    This was worse the second half of last year.    He is on Zoloft with some benefit.     He does not feel it is helping enough.   He also has anxiety all the time and often feels agitated.   He is sleeping poorly some nights and has had some nightmares.   He rotates shift at work ([employer]).   Shifts change every 2 weeks.      I personally reviewed the MRIs of the brain and cervical spine performed 03/30/2018 and 04/10/2018.  The brain shows multiple T2/flair hyperintense foci in the periventricular, juxtacortical deep white matter of the hemispheres consistent with MS and also shows a couple small foci in the left cerebellar hemisphere and pons.  About 6 of the hemispheric lesions enhanced after contrast.  The MRI of the cervical  spine showed lesions posteriorly to the left at C2, posteriorly at C3, bilaterally at C3 and towards the right at C3-C4, posteriorly at C5 and to the right at C6.   None of the foci enhance.    General labs were fine.   I don't see the JCV Ab result.    He has no family history of MS REVIEW OF SYSTEMS: Constitutional: No fevers, chills, sweats, or change in appetite.  He works a swing shift changing every 2 weeks.  Sleep is sometimes poor. Eyes: No visual changes, double vision, eye pain Ear, nose and throat: No hearing loss, ear pain, nasal congestion, sore throat Cardiovascular: No chest pain, palpitations Respiratory: No shortness of breath at rest or with exertion.   No wheezes GastrointestinaI: No nausea,  vomiting, diarrhea, abdominal pain, fecal incontinence Genitourinary: No dysuria, urinary retention or frequency.  No nocturia. Musculoskeletal: No neck pain, back pain Integumentary: No rash, pruritus, skin lesions Neurological: as above Psychiatric: Notes depression and anxiety Endocrine: No palpitations, diaphoresis, change in appetite, change in weigh or increased thirst Hematologic/Lymphatic: No anemia, purpura, petechiae. Allergic/Immunologic: No itchy/runny eyes, nasal congestion, recent allergic reactions, rashes  ALLERGIES: Allergies  Allergen Reactions  . Naproxen Hives    HOME MEDICATIONS:  Current Outpatient Medications:  .  Natalizumab (TYSABRI IV), Inject 1 Dose into the vein every 28 (twenty-eight) days. , Disp: , Rfl:  .  sertraline (ZOLOFT) 100 MG tablet, Take 1 tablet (100 mg total) by mouth daily., Disp: 30 tablet, Rfl: 5 .  hydrOXYzine (ATARAX/VISTARIL) 50 MG tablet, Take 1 tablet (50 mg total) by mouth 3 (three) times daily as needed for anxiety. (Patient not taking: Reported on 09/21/2018), Disp: 30 tablet, Rfl: 1  PAST MEDICAL HISTORY: Past Medical History:  Diagnosis Date  . Anxiety   . Depression   . Generalized headaches   . Hemorrhoids   . Multiple sclerosis (HCC)     PAST SURGICAL HISTORY: Past Surgical History:  Procedure Laterality Date  . TOTAL HIP ARTHROPLASTY     Left     FAMILY HISTORY: Family History  Problem Relation Age of Onset  . Coronary artery disease Maternal Grandmother   . Coronary artery disease Maternal Grandfather   . Colon cancer Neg Hx     SOCIAL HISTORY:  Social History   Socioeconomic History  . Marital status: Married    Spouse name: Seward Grater   . Number of children: 1  . Years of education: 39  . Highest education level: Not on file  Occupational History  . Occupation: HVAC  Social Needs  . Financial resource strain: Not on file  . Food insecurity:    Worry: Not on file    Inability: Not on file  .  Transportation needs:    Medical: Not on file    Non-medical: Not on file  Tobacco Use  . Smoking status: Never Smoker  . Smokeless tobacco: Current User    Types: Chew  Substance and Sexual Activity  . Alcohol use: No  . Drug use: No  . Sexual activity: Not on file  Lifestyle  . Physical activity:    Days per week: Not on file    Minutes per session: Not on file  . Stress: Not on file  Relationships  . Social connections:    Talks on phone: Not on file    Gets together: Not on file    Attends religious service: Not on file    Active member of club or organization: Not on  file    Attends meetings of clubs or organizations: Not on file    Relationship status: Not on file  . Intimate partner violence:    Fear of current or ex partner: Not on file    Emotionally abused: Not on file    Physically abused: Not on file    Forced sexual activity: Not on file  Other Topics Concern  . Not on file  Social History Narrative   Lives with wife   Caffeine use: 2 caffeine drinks daily    Right handed      PHYSICAL EXAM  Vitals:   09/21/18 0840  BP: 132/77  Pulse: 76  Weight: 235 lb 8 oz (106.8 kg)  Height: 5\' 11"  (1.803 m)    Body mass index is 32.85 kg/m.   General: The patient is well-developed and well-nourished and in no acute distress  Eyes:  Funduscopic exam shows normal optic discs and retinal vessels.  Neck: The neck is supple, no carotid bruits are noted.  The neck is nontender.  Cardiovascular: The heart has a regular rate and rhythm with a normal S1 and S2. There were no murmurs, gallops or rubs. Lungs are clear to auscultation.  Skin: Extremities are without significant edema.  Musculoskeletal:  Back is nontender  Neurologic Exam  Mental status: The patient is alert and oriented x 3 at the time of the examination. The patient has apparent normal recent and remote memory, with an apparently normal attention span and concentration ability.   Speech is  normal.  Cranial nerves: Extraocular movements are full. Pupils are equal, round, and reactive to light and accomodation.  Color vision is normal.  .  Facial symmetry is present. There is good facial sensation to soft touch bilaterally.Facial strength is normal.  Trapezius and sternocleidomastoid strength is normal. No dysarthria is noted.  The tongue is midline, and the patient has symmetric elevation of the soft palate. No obvious hearing deficits are noted.  Motor:  Muscle bulk is normal.   Tone is normal. Strength is  5 / 5 in all 4 extremities.   Sensory: Sensory testing is intact to pinprick, soft touch and vibration sensation in all 4 extremities.  Coordination: Cerebellar testing reveals good finger-nose-finger and heel-to-shin bilaterally.  Gait and station: Station is normal.   Gait is normal. Tandem gait is normal. Romberg is negative.   Reflexes: Deep tendon reflexes are symmetric and normal bilaterally.   Plantar responses are flexor.    DIAGNOSTIC DATA (LABS, IMAGING, TESTING) - I reviewed patient records, labs, notes, testing and imaging myself where available.  Lab Results  Component Value Date   WBC 6.2 03/17/2018   HGB 16.0 03/17/2018   HCT 46.5 03/17/2018   MCV 89 03/17/2018   PLT 205 03/17/2018      Component Value Date/Time   NA 140 03/17/2018 1644   K 4.4 03/17/2018 1644   CL 103 03/17/2018 1644   CO2 21 03/17/2018 1644   GLUCOSE 91 03/17/2018 1644   BUN 14 03/17/2018 1644   CREATININE 0.75 (L) 03/17/2018 1644   CALCIUM 9.2 03/17/2018 1644   PROT 7.0 03/17/2018 1644   ALBUMIN 4.5 03/17/2018 1644   AST 19 03/17/2018 1644   ALT 22 03/17/2018 1644   ALKPHOS 84 03/17/2018 1644   BILITOT 0.4 03/17/2018 1644   GFRNONAA 126 03/17/2018 1644   GFRAA 145 03/17/2018 1644   No results found for: CHOL, HDL, LDLCALC, LDLDIRECT, TRIG, CHOLHDL No results found for: EEFE0F Lab Results  Component Value Date   VITAMINB12 805 03/17/2018   Lab Results  Component  Value Date   TSH 0.828 12/13/2016       ASSESSMENT AND PLAN  Multiple sclerosis (HCC) - Plan: Stratify JCV Antibody Test ([facility]), CBC with Differential/Platelet  Depression, recurrent (HCC) - Plan: sertraline (ZOLOFT) 100 MG tablet  Pain in left hip  Anxiety  Shifting sleep-work schedule  High risk medication use - Plan: Stratify JCV Antibody Test ([facility]), CBC with Differential/Platelet   In summary, Mr. Bascom LevelsReeder is a 28 year old man recently diagnosed with MS.   He has about 5 lesions in the spinal cord and could potentially have a more aggressive course of MS.   Therefore, Donnamarie Poagysabri is a good choice as his disease modifying therapy and we will check a JCV antibody and CBC with differential today.   He works close to the office and we can change his infusion site.  A second issue is anxiety and depression.  Zoloft has helped a little bit and he feels the depression is noticeably better than last year but the anxiety is just a little bit better.  I will increase her Zoloft to 100 mg.  If not better within 3 to 4 weeks, consider adding Abilify 5 mg.  Alternatively we could consider BuSpar 15 mg twice daily.  He will return to see me in about 4 months but he will call sooner if there are new or worsening neurologic symptoms.      Thank you for asking me to see Mr. Bascom LevelsReeder.  Please let me know if I can be of further assistance with him or other patients in the future.  Tayveon Lombardo A. Epimenio FootSater, MD, PhD, FAAN Certified in Neurology, Clinical Neurophysiology, Sleep Medicine, Pain Medicine and Neuroimaging Director, [facility]  [facility] [address], [address] [place], [place] [address] [phone]

## 2018-09-21 NOTE — Telephone Encounter (Signed)
Gave completed/signed Tysabri start form to [facility]fusion to process for pt.

## 2018-09-21 NOTE — Telephone Encounter (Signed)
Placed JCV lab in [facility] lock box for routine lab pick up.  

## 2018-09-22 LAB — CBC WITH DIFFERENTIAL/PLATELET
Basophils Absolute: 0.1 10*3/uL (ref 0.0–0.2)
Basos: 1 %
EOS (ABSOLUTE): 0.3 10*3/uL (ref 0.0–0.4)
EOS: 4 %
HEMATOCRIT: 44.3 % (ref 37.5–51.0)
HEMOGLOBIN: 14.8 g/dL (ref 13.0–17.7)
IMMATURE GRANS (ABS): 0 10*3/uL (ref 0.0–0.1)
IMMATURE GRANULOCYTES: 0 %
LYMPHS: 36 %
Lymphocytes Absolute: 2.9 10*3/uL (ref 0.7–3.1)
MCH: 30.6 pg (ref 26.6–33.0)
MCHC: 33.4 g/dL (ref 31.5–35.7)
MCV: 92 fL (ref 79–97)
MONOCYTES: 8 %
Monocytes Absolute: 0.7 10*3/uL (ref 0.1–0.9)
NEUTROS ABS: 4.1 10*3/uL (ref 1.4–7.0)
NEUTROS PCT: 51 %
NRBC: 1 % — AB (ref 0–0)
Platelets: 191 10*3/uL (ref 150–450)
RBC: 4.83 x10E6/uL (ref 4.14–5.80)
RDW: 13.3 % (ref 11.6–15.4)
WBC: 8 10*3/uL (ref 3.4–10.8)

## 2018-09-30 ENCOUNTER — Encounter: Payer: Self-pay | Admitting: Family Medicine

## 2018-09-30 ENCOUNTER — Ambulatory Visit: Payer: 59 | Admitting: Family Medicine

## 2018-09-30 VITALS — BP 117/76 | HR 63 | Temp 95.3°F | Ht 71.0 in | Wt 231.4 lb

## 2018-09-30 DIAGNOSIS — F339 Major depressive disorder, recurrent, unspecified: Secondary | ICD-10-CM

## 2018-09-30 NOTE — Telephone Encounter (Signed)
JCV ab negative, titer: 0.19. Drawn on 09/21/18 Gave copy to [facility] for their records.

## 2018-09-30 NOTE — Progress Notes (Signed)
BP 117/76   Pulse 63   Temp (!) 95.3 F (35.2 C) (Axillary)   Ht 5\' 11"  (1.803 m)   Wt 231 lb 6.4 oz (105 kg)   BMI 32.27 kg/m    Subjective:    Patient ID: Isaac Scott, male    DOB: 1991/05/09, 28 y.o.   MRN: [id number]  HPI: Isaac Scott is a 28 y.o. male presenting on 09/30/2018 for Depression (1 month follow up)   HPI Depression recheck Patient is coming in for depression recheck today.  He was increased just week and a half ago from Zoloft 50 to Zoloft 100 and he feels like things are going better right now and he is feeling like his symptoms are a lot controlled right now.  He denies any suicidal ideations or thoughts of hurting himself.  A lot of his depression and anxiety stems from the recent diagnosis of MS and doing with prophylactic medication for that.  He has not had any recent relapses but just a lot of stress related to the unknown. Depression screen [facility] 2/9 09/30/2018 09/02/2018 02/11/2018 12/19/2017 11/25/2017  Decreased Interest 1 2 0 0 0  Down, Depressed, Hopeless 2 2 0 0 0  PHQ - 2 Score 3 4 0 0 0  Altered sleeping 2 3 - - -  Tired, decreased energy 2 2 - - -  Change in appetite 0 0 - - -  Feeling bad or failure about yourself  0 2 - - -  Trouble concentrating 0 0 - - -  Moving slowly or fidgety/restless 0 0 - - -  Suicidal thoughts 0 2 - - -  PHQ-9 Score 7 13 - - -  Difficult doing work/chores - - - - -     Relevant past medical, surgical, family and social history reviewed and updated as indicated. Interim medical history since our last visit reviewed. Allergies and medications reviewed and updated.  Review of Systems  Constitutional: Negative for chills and fever.  Eyes: Negative for visual disturbance.  Respiratory: Negative for shortness of breath and wheezing.   Cardiovascular: Negative for chest pain and leg swelling.  Musculoskeletal: Negative for back pain and gait problem.  Skin: Negative for rash.  Psychiatric/Behavioral: Positive for  dysphoric mood. Negative for self-injury, sleep disturbance and suicidal ideas. The patient is nervous/anxious.   All other systems reviewed and are negative.   Per HPI unless specifically indicated above   Allergies as of 09/30/2018      Reactions   Naproxen Hives      Medication List       Accurate as of September 30, 2018  8:54 AM. Always use your most recent med list.        hydrOXYzine 50 MG tablet Commonly known as:  ATARAX/VISTARIL Take 1 tablet (50 mg total) by mouth 3 (three) times daily as needed for anxiety.   sertraline 100 MG tablet Commonly known as:  ZOLOFT Take 1 tablet (100 mg total) by mouth daily.   TYSABRI IV Inject 1 Dose into the vein every 28 (twenty-eight) days.          Objective:    BP 117/76   Pulse 63   Temp (!) 95.3 F (35.2 C) (Axillary)   Ht 5\' 11"  (1.803 m)   Wt 231 lb 6.4 oz (105 kg)   BMI 32.27 kg/m   Wt Readings from Last 3 Encounters:  09/30/18 231 lb 6.4 oz (105 kg)  09/21/18 235 lb 8 oz (  106.8 kg)  09/14/18 236 lb 3.2 oz (107.1 kg)    Physical Exam Vitals signs and nursing note reviewed.  Constitutional:      General: He is not in acute distress.    Appearance: He is well-developed. He is not diaphoretic.  Eyes:     General: No scleral icterus.    Conjunctiva/sclera: Conjunctivae normal.  Neck:     Musculoskeletal: Neck supple.     Thyroid: No thyromegaly.  Cardiovascular:     Rate and Rhythm: Normal rate and regular rhythm.     Heart sounds: Normal heart sounds. No murmur.  Pulmonary:     Effort: Pulmonary effort is normal. No respiratory distress.     Breath sounds: Normal breath sounds. No wheezing.  Musculoskeletal: Normal range of motion.  Lymphadenopathy:     Cervical: No cervical adenopathy.  Skin:    General: Skin is warm and dry.     Findings: No rash.  Neurological:     Mental Status: He is alert and oriented to person, place, and time.     Coordination: Coordination normal.  Psychiatric:         Mood and Affect: Mood is anxious and depressed.        Behavior: Behavior normal.        Thought Content: Thought content does not include suicidal ideation. Thought content does not include suicidal plan.        Cognition and Memory: Cognition normal.        Judgment: Judgment normal.         Assessment & Plan:   Problem List Items Addressed This Visit      Other   Depression, recurrent (HCC) - Primary      Will continue on Zoloft 100 for now, see back in 3 months unless he is having any issues he will come back sooner. Follow up plan: Return if symptoms worsen or fail to improve.  Counseling provided for all of the vaccine components No orders of the defined types were placed in this encounter.   Arville Care, MD [facility] 09/30/2018, 8:54 AM

## 2018-10-07 ENCOUNTER — Telehealth: Payer: Self-pay | Admitting: Neurology

## 2018-10-07 NOTE — Telephone Encounter (Signed)
pt has called for the intrafusion suite, call transferred °

## 2018-10-07 NOTE — Progress Notes (Signed)
Notice received from [employer].  Patient off of med at this time. Chart sent to Medical records for scanning.

## 2018-10-08 ENCOUNTER — Telehealth: Payer: Self-pay | Admitting: *Deleted

## 2018-10-08 NOTE — Telephone Encounter (Signed)
Received letter dated 09/27/18 from [facility] that Tysabri inj. Approved 09/25/18-08/05/19 for 12 visits. Procedure code: B3112. Gave to [facility] for their records.

## 2018-10-12 ENCOUNTER — Encounter (HOSPITAL_COMMUNITY): Payer: Self-pay

## 2018-10-15 ENCOUNTER — Telehealth: Payer: Self-pay | Admitting: Neurology

## 2018-10-15 NOTE — Telephone Encounter (Signed)
Patient request to be transferred to infusion

## 2018-11-09 DIAGNOSIS — G35 Multiple sclerosis: Secondary | ICD-10-CM | POA: Diagnosis not present

## 2018-11-18 ENCOUNTER — Telehealth: Payer: Self-pay | Admitting: *Deleted

## 2018-11-18 NOTE — Telephone Encounter (Signed)
Took call from phone staff. Spoke with Marcelino Duster [employer]). Confirmed patient being followed by Dr. Epimenio Foot and getting Tysabri infusions here.

## 2018-11-20 ENCOUNTER — Ambulatory Visit (INDEPENDENT_AMBULATORY_CARE_PROVIDER_SITE_OTHER): Payer: 59 | Admitting: Family Medicine

## 2018-11-20 ENCOUNTER — Other Ambulatory Visit: Payer: Self-pay

## 2018-11-20 DIAGNOSIS — G35 Multiple sclerosis: Secondary | ICD-10-CM

## 2018-11-20 DIAGNOSIS — Z7189 Other specified counseling: Secondary | ICD-10-CM | POA: Diagnosis not present

## 2018-11-20 NOTE — Progress Notes (Signed)
Telephone visit  Subjective: CC: MS (possible exposure to COVID19) PCP: Dettinger, Elige Radon, MD [id number] Isaac Scott is Isaac 28 y.o. male calls for telephone consult today. Patient provides verbal consent for consult held via phone.  Location of patient: work Location of provider: [facility] Others present for call: none  1. Possible COVID19 exposure Patient calling with concerns with regards to COVID-19 exposure.  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  He denies any concerning symptoms or signs at this time but worries about significant complications if he were to contract the virus.  He attempted to reach out to his new neurologist, Dr. Epimenio Foot but unfortunately their office is closed on Fridays.  He will try and reach out again on Monday.   ROS: Per HPI  Allergies  Allergen Reactions  . Naproxen Hives   Past Medical History:  Diagnosis Date  . Anxiety   . Depression   . Generalized headaches   . Hemorrhoids   . Multiple sclerosis (HCC)     Current Outpatient Medications:  .  hydrOXYzine (ATARAX/VISTARIL) 50 MG tablet, Take 1 tablet (50 mg total) by mouth 3 (three) times daily as needed for anxiety., Disp: 30 tablet, Rfl: 1 .  Natalizumab (TYSABRI IV), Inject 1 Dose into the vein every 28 (twenty-eight) days. , Disp: , Rfl:  .  sertraline (ZOLOFT) 100 MG tablet, Take 1 tablet (100 mg total) by mouth daily., Disp: 30 tablet, Rfl: 5  Assessment/ Plan: 28 y.o. male   1. Multiple sclerosis (HCC) Patient being treated for MS with Tysabri by Dr. Epimenio Foot.  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  He is considering taking Isaac leave of absence from work secondary to this.  I have advised him to go ahead and get more information with regards to what this would entail as I do think he is  moderate risk for COVID-19 complications.  I will also reach out to his specialist to get their opinion.  He will contact me on Monday and we will make Isaac plan then.  2. Advice Given About Covid-19 Virus by Telephone As above   Start time: 3:20pm End time: 3:27pm  Total time spent on patient care (including telephone call/ virtual visit): 10 minutes  Gordana Kewley Hulen Skains, DO [facility] [phone]

## 2018-11-23 NOTE — Telephone Encounter (Signed)
Pt called in and stated that there were 2 confirmed cases of COVID-19 and his employment and he wants to know with him have MS what precautions he needs to take

## 2018-11-23 NOTE — Telephone Encounter (Signed)
Pt has returned the ca ll to Marlis Edelson , please call

## 2018-11-23 NOTE — Telephone Encounter (Signed)
I called pt back. He states some people are working from home and some in the office. 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 But they are closed off from each other. Other case at different plant.  He is concerned about being on Tysabri for MS and if he should start working from home. Advised I will discuss with Dr. Epimenio Foot and call back.

## 2018-11-23 NOTE — Telephone Encounter (Signed)
Called, LVM returning pt call. Asked him to call back. Wanted to get some more info about this- does he work in close contact with confirmed cases, are they following CDC guidelines while he is at work?

## 2018-11-23 NOTE — Telephone Encounter (Signed)
Called, LVM for pt relaying Dr. Bonnita Hollow message. Asked him to call back if he needs work note.

## 2018-11-23 NOTE — Telephone Encounter (Signed)
Although Tysabri does not appear to add much risk for COVID-19, however, if he comes in contact with multiple people a day at work it would probably be best for him to work at home if he can.    Does he need a note to be allowed to work at home?

## 2018-11-24 ENCOUNTER — Encounter: Payer: Self-pay | Admitting: Family Medicine

## 2018-11-24 ENCOUNTER — Telehealth: Payer: Self-pay | Admitting: *Deleted

## 2018-11-24 ENCOUNTER — Ambulatory Visit (INDEPENDENT_AMBULATORY_CARE_PROVIDER_SITE_OTHER): Payer: 59 | Admitting: Family Medicine

## 2018-11-24 ENCOUNTER — Other Ambulatory Visit: Payer: Self-pay

## 2018-11-24 DIAGNOSIS — Z7189 Other specified counseling: Secondary | ICD-10-CM | POA: Diagnosis not present

## 2018-11-24 DIAGNOSIS — G35 Multiple sclerosis: Secondary | ICD-10-CM

## 2018-11-24 NOTE — Progress Notes (Signed)
Telephone visit  Subjective: CC: Follow-up MS PCP: Isaac Scott, Isaac Radon, MD AXE:NMMHW Isaac Scott is Isaac 28 y.o. male calls for telephone consult today. Patient provides verbal consent for consult held via phone.  Location of patient: Work Location of provider: Working remotely from home Others present for call: None  1.  Multiple sclerosis The patient contacted me on 11/20/2018 with regards to concerns of COVID-19 exposure in the setting of treatment for multiple sclerosis.  I have since spoken to his specialist, who noted that the medication he is on is actually 1 of the safest with regards to the immune system.  His opinion was that the patient had average risk compared to other people his age and weight for complication if he were to become infected with COVID-19.  The patient notes that there were more people formally diagnosed/positive for COVID-19 in his workplace.  He is interested in going ahead and pursuing being out of work to limit risk of exposure.  At this time, he is not demonstrating any signs or symptoms concerning for COVID-19 infection.  He will require Isaac work note with time off parameters and return to work date.  He will then coordinate formal paperwork with his third-party short-term disability provider.   ROS: Per HPI  Allergies  Allergen Reactions  . Naproxen Hives   Past Medical History:  Diagnosis Date  . Anxiety   . Depression   . Generalized headaches   . Hemorrhoids   . Multiple sclerosis (HCC)     Current Outpatient Medications:  .  hydrOXYzine (ATARAX/VISTARIL) 50 MG tablet, Take 1 tablet (50 mg total) by mouth 3 (three) times daily as needed for anxiety., Disp: 30 tablet, Rfl: 1 .  Natalizumab (TYSABRI IV), Inject 1 Dose into the vein every 28 (twenty-eight) days. , Disp: , Rfl:  .  sertraline (ZOLOFT) 100 MG tablet, Take 1 tablet (100 mg total) by mouth daily., Disp: 30 tablet, Rfl: 5  Assessment/ Plan: 28 y.o. male   1. Multiple sclerosis (HCC) I have  again reached out to his specialist with regards to indications for time off from work given most recent conversation with the specialist, who again stated that patient was average risk.  At this time, his time off may be simply voluntary rather than medically mediated.  I did reiterate this to the patient.  I am still glad to fill out any forms needed but we discussed that there is Isaac possibility that the short-term disability would be denied if there is not Isaac solid medical reason why he needs the time off.  He voiced good understanding and will reach out to his specialist for input.  He will contact me if needed.  2. Advice Given About Covid-19 Virus by Telephone As above   Start time: 10:43am End time: 10:54am  Total time spent on patient care (including telephone call/ virtual visit): 17 minutes  Isaac Scott Hulen Skains, Isaac Scott [facility] [phone]

## 2018-11-24 NOTE — Telephone Encounter (Signed)
Faxed completed/signed Tysabri pt status report and reauth questionnaire to MS touch at [phone]. Received confirmation.  

## 2018-11-24 NOTE — Progress Notes (Signed)
Even though Tysabri is theoretically safer than some other MS medications, we don't have a lot of info to go on (just some reports from [place], [place] and [place]).  Since there have been cases at his work, I thught I would go ahead and take him out for 6 weeks or so.  I think HR is supposed to fax [place] forms to complete.

## 2018-11-24 NOTE — Telephone Encounter (Signed)
Pt called back. He would have to take STD. Cannot work from home. Find out yesterday they have 3 confirmed cases now at work. Another guy that works in his department's brother tested positive. They are testing him.   I asked he contact HR department and have them fax forms to [place] at [phone] to be filled out. He wanted to know how long Dr. Epimenio Foot will write him out for. Advised that would be up to Dr. Bonnita Hollow discretion. We can let him know. He verbalized understanding.

## 2018-11-25 NOTE — Telephone Encounter (Signed)
Received fax notification from touch prescribing program that pt re-auth for Tysabri 11/20/18-05/21/19. Enrollment number: [id number]. Account: [employer]. Site auth number: [id number].

## 2018-12-03 ENCOUNTER — Telehealth: Payer: Self-pay | Admitting: Neurology

## 2018-12-03 NOTE — Telephone Encounter (Signed)
Pt has called for the infusion suite, call connected. °

## 2018-12-07 DIAGNOSIS — G35 Multiple sclerosis: Secondary | ICD-10-CM | POA: Diagnosis not present

## 2018-12-28 IMAGING — MR MR CERVICAL SPINE WO/W CM
4 of 8 series · 17 of 48 positions shown · IV contrast (20ml Multihance)
Comparison: None.

CLINICAL DATA: Multiple sclerosis. Headache with right arm and leg
pain for 4 weeks.

EXAM:
MRI CERVICAL SPINE WITHOUT AND WITH CONTRAST
TECHNIQUE: Multiplanar and multiecho pulse sequences of the cervical spine, to
include the craniocervical junction and cervicothoracic junction,
were obtained without and with intravenous contrast.
CONTRAST:  20mL MULTIHANCE GADOBENATE DIMEGLUMINE 529 MG/ML IV SOLN

[Series 7: T2 · axial · 3.0mm · 0.19mm/px · z∈[-226,-111]mm · 8 of 36 slices shown (1 of 2)]
[im 1/36]
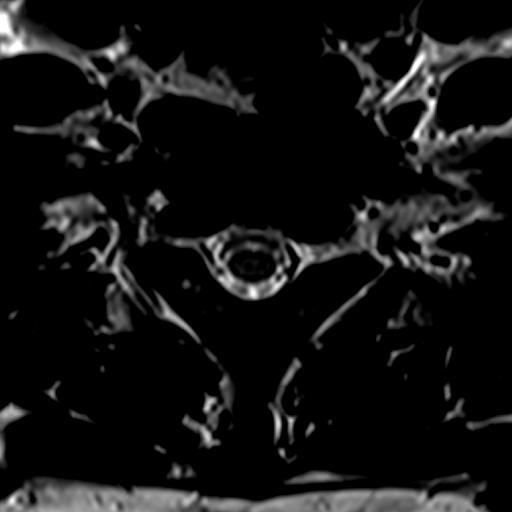
[im 6/36]
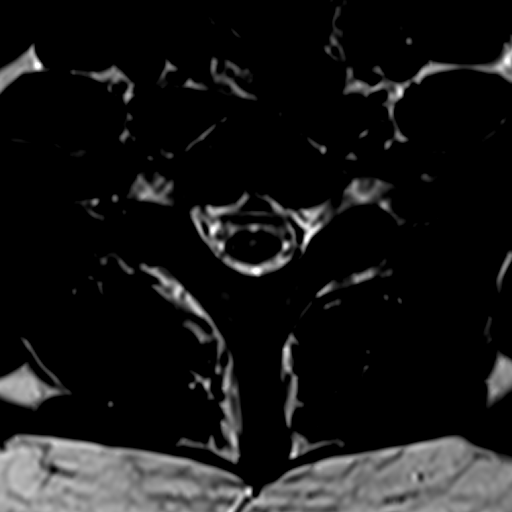
[im 11/36]
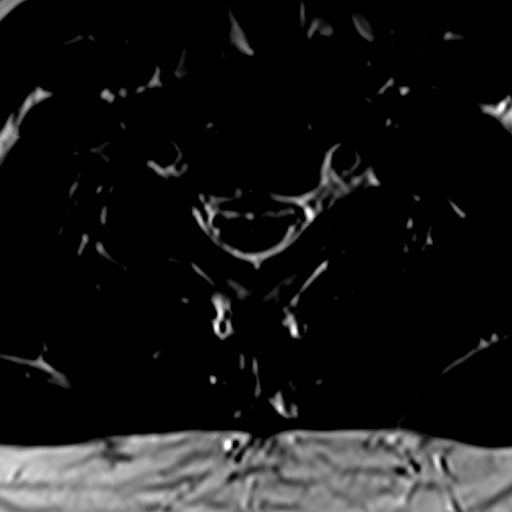
[im 16/36]
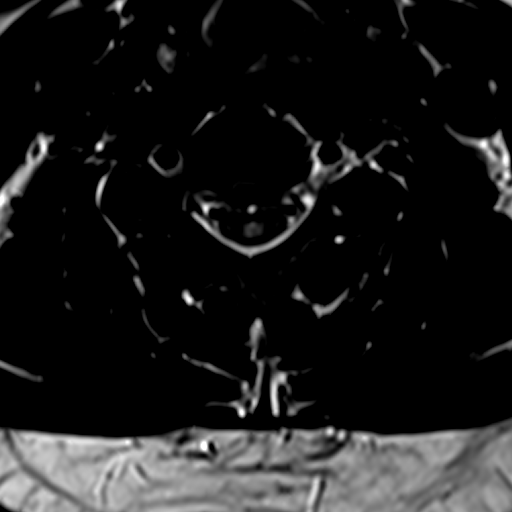
[im 21/36]
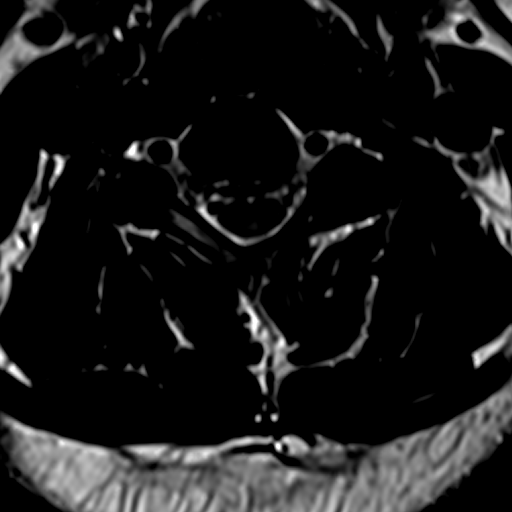
[im 26/36]
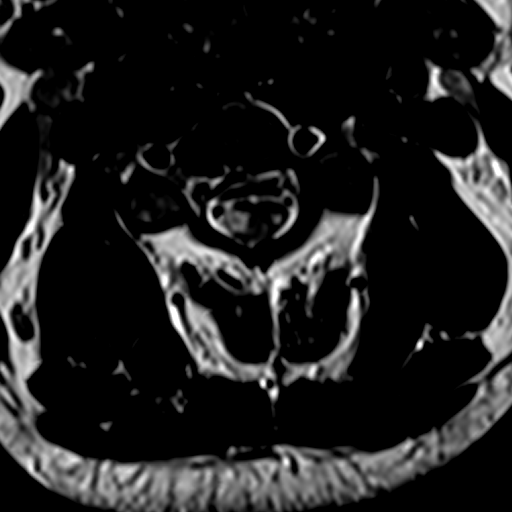
[im 31/36]
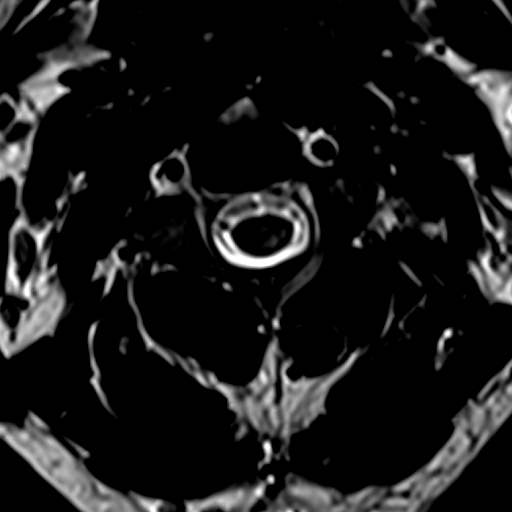
[im 36/36]
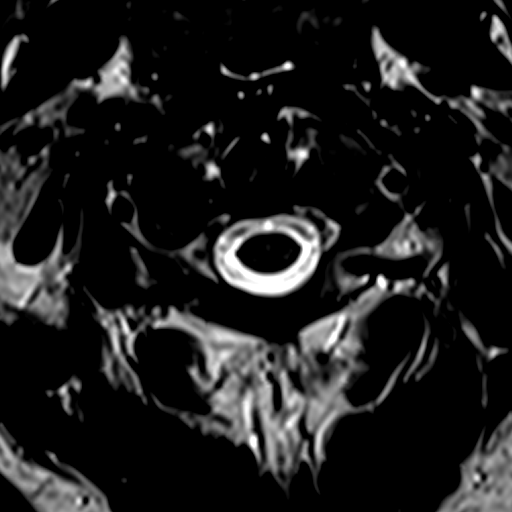

[Series 8: T1 · axial · 3.0mm · 0.15mm/px · z∈[-210,-128]mm · 3 of 36 slices shown (1 of 2)]
[im 6/36]
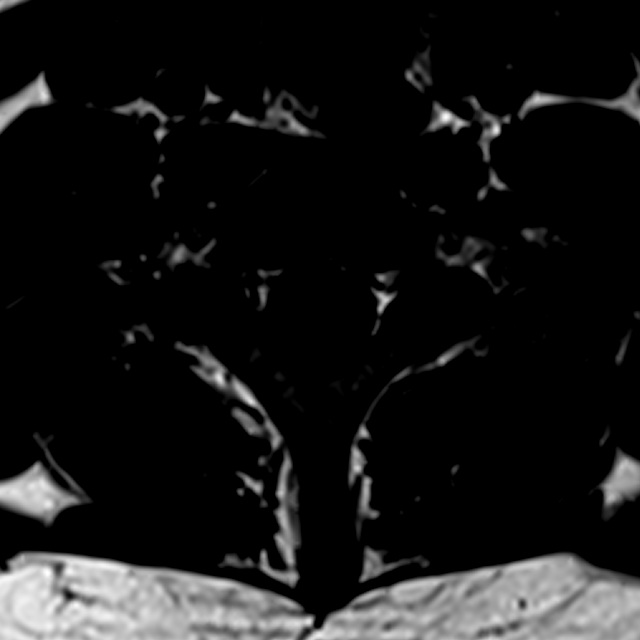
[im 21/36]
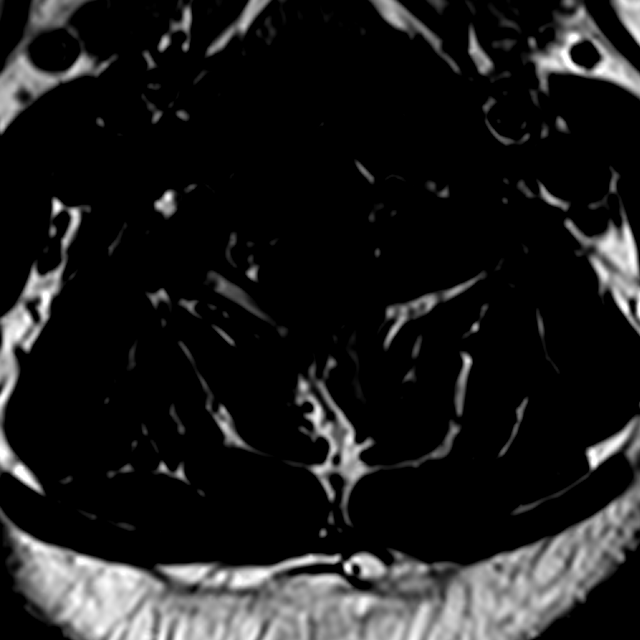
[im 31/36]
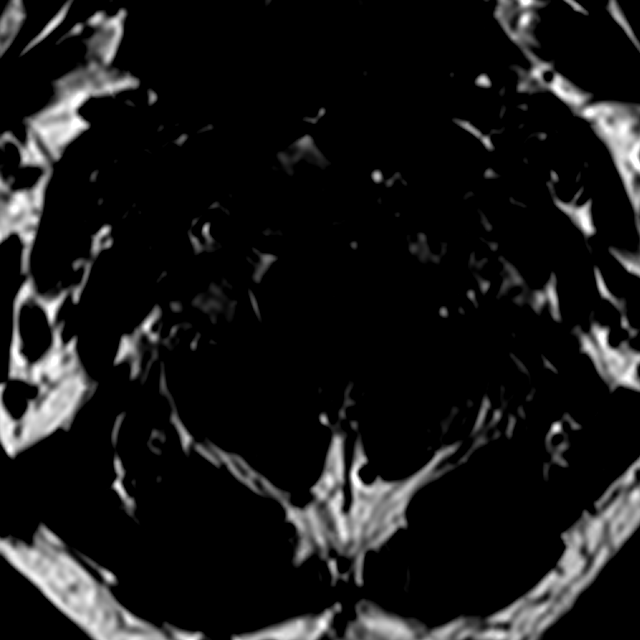

[Series 9: T2 · sagittal · 3.0mm · 0.47mm/px · 3 of 15 slices shown (2 of 2)]
[im 1/15]
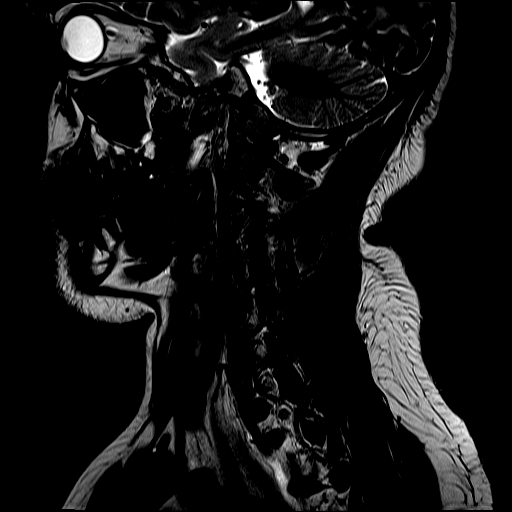
[im 10/15]
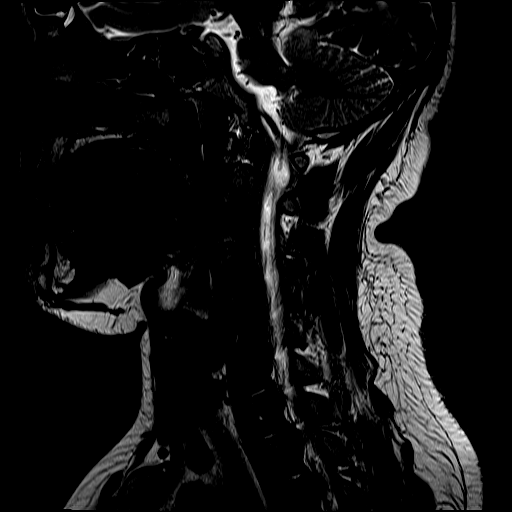
[im 15/15]
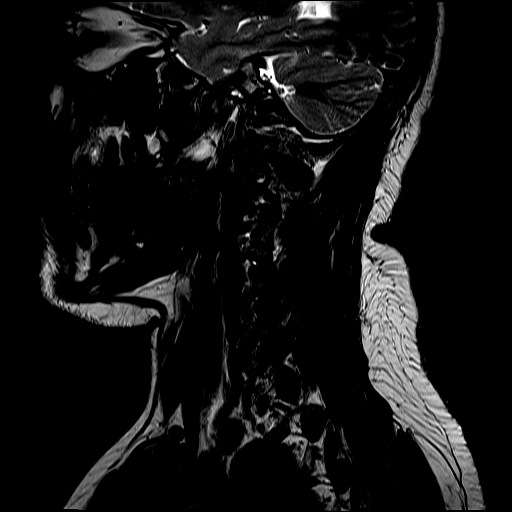

[Series 11: T1 · axial · 3.0mm · 0.15mm/px · z∈[-210,-128]mm · 3 of 36 slices shown (2 of 2)]
[im 6/36]
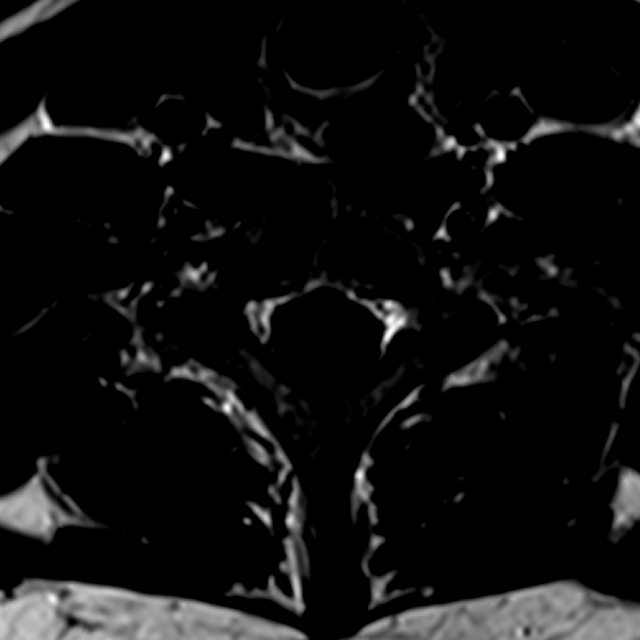
[im 21/36]
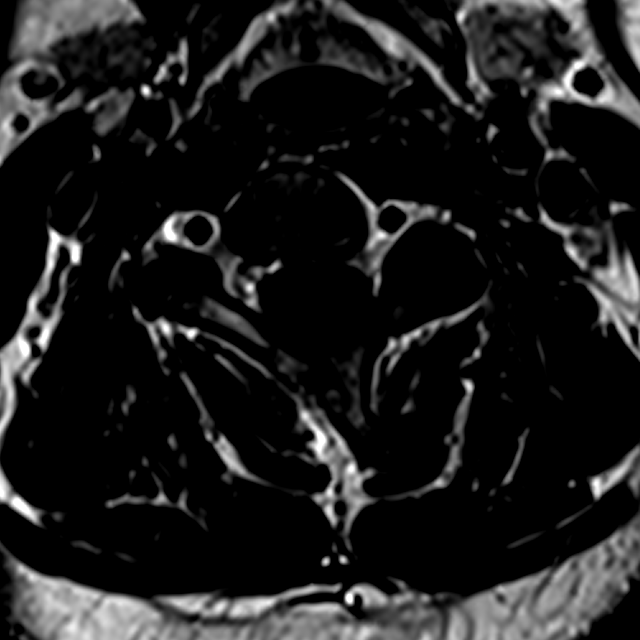
[im 31/36]
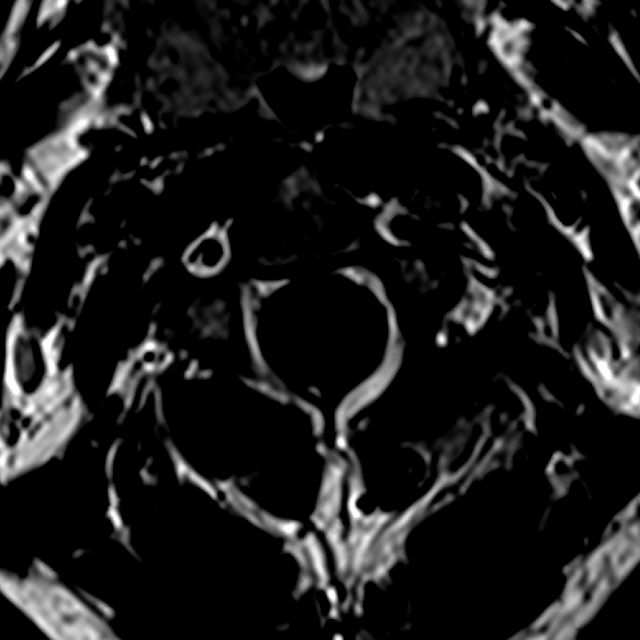

[17 of 48 positions shown; findings below may reference images not displayed]

FINDINGS: Alignment: Unremarkable

Vertebrae: No fracture, evidence of discitis, or bone lesion.
Partial segmentation at C3-4.

Cord: Hyperintense short-segment plaques seen in the:

Left posterior cord at C2

Bilateral lateral cord at C3

Central cord at C5

Linear increased T2 signal in the central cord at C7 is likely
visible central canal.

Posterior Fossa, vertebral arteries, paraspinal tissues:
Demyelinating foci in the bilateral superficial pons.

Disc levels:

No degenerative changes or impingement.
IMPRESSION: Four plaques seen in the cervical cord as described above. The
plaques are nonenhancing.

## 2019-01-01 ENCOUNTER — Ambulatory Visit: Payer: 59 | Admitting: Family Medicine

## 2019-01-04 ENCOUNTER — Ambulatory Visit: Payer: 59 | Admitting: Neurology

## 2019-01-04 ENCOUNTER — Telehealth: Payer: Self-pay | Admitting: *Deleted

## 2019-01-04 NOTE — Telephone Encounter (Signed)
Pt here today for Tysabri infusion. Spoke with him and updated med list, pharmacy, allergies for VV with Dr. Epimenio Foot tomorrow. Verified he received email for VV. Explained [email or web address] process.

## 2019-01-05 ENCOUNTER — Other Ambulatory Visit: Payer: Self-pay

## 2019-01-05 ENCOUNTER — Encounter: Payer: Self-pay | Admitting: Neurology

## 2019-01-05 ENCOUNTER — Ambulatory Visit (INDEPENDENT_AMBULATORY_CARE_PROVIDER_SITE_OTHER): Payer: 59 | Admitting: Neurology

## 2019-01-05 DIAGNOSIS — G35 Multiple sclerosis: Secondary | ICD-10-CM | POA: Diagnosis not present

## 2019-01-05 DIAGNOSIS — R208 Other disturbances of skin sensation: Secondary | ICD-10-CM | POA: Diagnosis not present

## 2019-01-05 DIAGNOSIS — G4726 Circadian rhythm sleep disorder, shift work type: Secondary | ICD-10-CM

## 2019-01-05 DIAGNOSIS — R5383 Other fatigue: Secondary | ICD-10-CM

## 2019-01-05 MED ORDER — ARMODAFINIL 200 MG PO TABS: ORAL_TABLET | ORAL | 5 refills | Status: DC

## 2019-01-05 MED ORDER — GABAPENTIN 300 MG PO CAPS: ORAL_CAPSULE | ORAL | 11 refills | Status: DC

## 2019-01-05 NOTE — Progress Notes (Signed)
[facility]  PATIENT: Isaac Scott DOB: [birth year]  REFERRING DOCTOR OR PCP:  Arville Care, MD SOURCE: patient, notes from Dr. Louanne Skye, MRI and lab reports, MRI images on PACS.   _________________________________   HISTORICAL  CHIEF COMPLAINT:  Chief Complaint  Patient presents with   Multiple Sclerosis    OnTysabri    HISTORY OF PRESENT ILLNESS:  Isaac Scott is a 28 y.o. man with multiple sclerosis.  Update 01/05/2019: Virtual Visit via Video Note I connected with  Isaac Scott on 01/05/19 at  1:30 PM EDT by a video enabled telemedicine application and verified that I am speaking with the correct person.  I discussed the limitations of evaluation and management by telemedicine and the availability of in person appointments. The patient expressed understanding and agreed to proceed.  History of Present Illness: He is on Tysabri and he tolerates it well.  He denies any MS exacerbations though he has had some new sensory symptoms. He is noting a tight sensation in the upper torso. This lasts 20-120 minutes and then improves.    Pain can vary between 3/10 and 9/10.  He feels he is walking about the same.   When he first wakes up, he feels a little weaker but is better once out of bed.   Sometimes he stays in bed a few minutes after waking up before he gets out, especially if he goes to his son's crib to make sure he is more stable,.  Strength is fine.   He is noting more urinary hesitancy  He has a daily headache closer to the vertex.  Tylenol sometimes helps but not consistently.     He notes insomnia and sometimes wakes up and can't go back to sleep.  He notes daily fatigue.  Of note, he does rotate shifts so his sleep schedule can be variable.   He does not think he snores.   He feels short term memory is worse this year compared to last year.  Depression is better with 100 mg sertraline than 50 mg but he still has anxiety.        Observations/Objective: He is a well-developed well-nourished woman in no acute distress.  The head is normocephalic and atraumatic.  Sclera are anicteric.  Visible skin appears normal.  The neck has a good range of motion.  Pharynx and tongue have normal appearance.  He is alert and fully oriented with fluent speech and good attention, knowledge and memory.  Extraocular muscles are intact.  Facial strength is normal.  Palatal elevation and tongue protrusion are midline.  He appears to have normal strength in the arms.  Rapid alternating movements and finger-nose-finger are performed well.    Assessment and Plan: Multiple sclerosis (HCC)  Shifting sleep-work schedule  Dysesthesia  Other fatigue  1.  Continue Tysabri.  He is JCV antibody negative  2.   Armodafinil for fatigue and shiftwork disorder. 3.   Gabapentin for dysesthesias. 4.   Try to avoid diphenhydramine as it may worsen the urinary hesitancy. 5.   Return to see me in 6 months or sooner if there are new or worsening neurologic symptoms.  Follow Up Instructions: I discussed the assessment and treatment plan with the patient. The patient was provided an opportunity to ask questions and all were answered. The patient agreed with the plan and demonstrated an understanding of the instructions.    The patient was advised to call back or seek an in-person evaluation if the symptoms worsen  or if the condition fails to improve as anticipated.  I provided 30 minutes of non-face-to-face time during this encounter.  _________________________________________________ He is a 28 yo man who was diagnosed with MS 04/2018.    In August 2019, he had numbness in his feet and then the numbness progressed up the right side.   Right hand coordination was reduced and voice was mildly slurred.    He initially had a CT scan which was normal.  An MRI, however, showed multiple lesions consistent with MS.   He then was referred to Dr. Gerilyn Pilgrimoonquah.   He had an  MRI of the cervical spine and contrasted brain showing 6 enhancing lesions in the hemispheres and multiple lesions in the cervical spine (no cervical spine lesion enhanced).    He was infused with IV steroids x 3 days and noted improvement in symptoms by a week or two.    He was placed on Tysabri (has had 4-5 infusions) and tolerates it well.   [facility]usions [facility] and his last infusion was 09/14/18.   He has not had any new symptoms since starting Tysabri.  He notes that his fatigue feels better for about 3 weeks out of each cycle.  Currently, he feels gait, strength, sensation and coordination are back to baseline.   Vision is normal.   He never had optic neuritis symptoms or diplopia.     Fatigue is poor, physical > cognitive.   He feels apathetic as well.    He has had some issues with depression and anxiety.    This was worse the second half of last year.    He is on Zoloft with some benefit.     He does not feel it is helping enough.   He also has anxiety all the time and often feels agitated.   He is sleeping poorly some nights and has had some nightmares.   He rotates shift at work [place] [place] [employer]   Shifts change every 2 weeks.      I personally reviewed the MRIs of the brain and cervical spine performed 03/30/2018 and 04/10/2018.  The brain shows multiple T2/flair hyperintense foci in the periventricular, juxtacortical deep white matter of the hemispheres consistent with MS and also shows a couple small foci in the left cerebellar hemisphere and pons.  About 6 of the hemispheric lesions enhanced after contrast.  The MRI of the cervical spine showed lesions posteriorly to the left at C2, posteriorly at C3, bilaterally at C3 and towards the right at C3-C4, posteriorly at C5 and to the right at C6.   None of the foci enhance.    General labs were fine.   I don't see the JCV Ab result.    He has no family history of MS REVIEW OF SYSTEMS: Constitutional: No fevers, chills, sweats, or change  in appetite.  He works a swing shift changing every 2 weeks.  Sleep is sometimes poor. Eyes: No visual changes, double vision, eye pain Ear, nose and throat: No hearing loss, ear pain, nasal congestion, sore throat Cardiovascular: No chest pain, palpitations Respiratory: No shortness of breath at rest or with exertion.   No wheezes GastrointestinaI: No nausea, vomiting, diarrhea, abdominal pain, fecal incontinence Genitourinary: No dysuria, urinary retention or frequency.  No nocturia. Musculoskeletal: No neck pain, back pain Integumentary: No rash, pruritus, skin lesions Neurological: as above Psychiatric: Notes depression and anxiety Endocrine: No palpitations, diaphoresis, change in appetite, change in weigh or increased  thirst Hematologic/Lymphatic: No anemia, purpura, petechiae. Allergic/Immunologic: No itchy/runny eyes, nasal congestion, recent allergic reactions, rashes  ALLERGIES: Allergies  Allergen Reactions   Naproxen Hives    HOME MEDICATIONS:  Current Outpatient Medications:    Armodafinil 200 MG TABS, 1/2 to one po qAM, Disp: 30 tablet, Rfl: 5   gabapentin (NEURONTIN) 300 MG capsule, One po qAM, one po qPM and 2 po qHS, Disp: 120 capsule, Rfl: 11   hydrOXYzine (ATARAX/VISTARIL) 50 MG tablet, Take 1 tablet (50 mg total) by mouth 3 (three) times daily as needed for anxiety. (Patient not taking: Reported on 01/04/2019), Disp: 30 tablet, Rfl: 1   Natalizumab (TYSABRI IV), Inject 1 Dose into the vein every 28 (twenty-eight) days. , Disp: , Rfl:    sertraline (ZOLOFT) 100 MG tablet, Take 1 tablet (100 mg total) by mouth daily., Disp: 30 tablet, Rfl: 5  PAST MEDICAL HISTORY: Past Medical History:  Diagnosis Date   Anxiety    Depression    Generalized headaches    Hemorrhoids    Multiple sclerosis (HCC)     PAST SURGICAL HISTORY: Past Surgical History:  Procedure Laterality Date   TOTAL HIP ARTHROPLASTY     Left     FAMILY HISTORY: Family History    Problem Relation Age of Onset   Coronary artery disease Maternal Grandmother    Coronary artery disease Maternal Grandfather    Colon cancer Neg Hx     SOCIAL HISTORY:  Social History   Socioeconomic History   Marital status: Married    Spouse name: Maggie    Number of children: 1   Years of education: 12   Highest education level: Not on file  Occupational History   Occupation: Systems developer strain: Not on file   Food insecurity:    Worry: Not on file    Inability: Not on file   Transportation needs:    Medical: Not on file    Non-medical: Not on file  Tobacco Use   Smoking status: Never Smoker   Smokeless tobacco: Current User    Types: Chew  Substance and Sexual Activity   Alcohol use: No   Drug use: No   Sexual activity: Not on file  Lifestyle   Physical activity:    Days per week: Not on file    Minutes per session: Not on file   Stress: Not on file  Relationships   Social connections:    Talks on phone: Not on file    Gets together: Not on file    Attends religious service: Not on file    Active member of club or organization: Not on file    Attends meetings of clubs or organizations: Not on file    Relationship status: Not on file   Intimate partner violence:    Fear of current or ex partner: Not on file    Emotionally abused: Not on file    Physically abused: Not on file    Forced sexual activity: Not on file  Other Topics Concern   Not on file  Social History Narrative   Lives with wife   Caffeine use: 2 caffeine drinks daily    Right handed      PHYSICAL EXAM  There were no vitals filed for this visit.  There is no height or weight on file to calculate BMI.   General: The patient is well-developed and well-nourished and in no acute distress  Eyes:  Funduscopic exam shows normal  optic discs and retinal vessels.  Neck: The neck is supple, no carotid bruits are noted.  The neck is  nontender.  Cardiovascular: The heart has a regular rate and rhythm with a normal S1 and S2. There were no murmurs, gallops or rubs. Lungs are clear to auscultation.  Skin: Extremities are without significant edema.  Musculoskeletal:  Back is nontender  Neurologic Exam  Mental status: The patient is alert and oriented x 3 at the time of the examination. The patient has apparent normal recent and remote memory, with an apparently normal attention span and concentration ability.   Speech is normal.  Cranial nerves: Extraocular movements are full. Pupils are equal, round, and reactive to light and accomodation.  Color vision is normal.  .  Facial symmetry is present. There is good facial sensation to soft touch bilaterally.Facial strength is normal.  Trapezius and sternocleidomastoid strength is normal. No dysarthria is noted.  The tongue is midline, and the patient has symmetric elevation of the soft palate. No obvious hearing deficits are noted.  Motor:  Muscle bulk is normal.   Tone is normal. Strength is  5 / 5 in all 4 extremities.   Sensory: Sensory testing is intact to pinprick, soft touch and vibration sensation in all 4 extremities.  Coordination: Cerebellar testing reveals good finger-nose-finger and heel-to-shin bilaterally.  Gait and station: Station is normal.   Gait is normal. Tandem gait is normal. Romberg is negative.   Reflexes: Deep tendon reflexes are symmetric and normal bilaterally.   Plantar responses are flexor.     Tyrique Sporn A. Epimenio Foot, MD, PhD, FAAN Certified in Neurology, Clinical Neurophysiology, Sleep Medicine, Pain Medicine and Neuroimaging Director, [facility]  [facility] [address], [address] [place], [place] [address] [phone]

## 2019-01-22 ENCOUNTER — Ambulatory Visit: Payer: 59 | Admitting: Family Medicine

## 2019-02-23 ENCOUNTER — Telehealth: Payer: Self-pay | Admitting: Neurology

## 2019-02-23 NOTE — Telephone Encounter (Signed)
Patient requested for RN to call him to discuss treatment.

## 2019-02-23 NOTE — Telephone Encounter (Signed)
Pt scheduled to come in tomorrow to see Dr. Felecia Shelling at 330pm

## 2019-02-24 ENCOUNTER — Ambulatory Visit: Payer: 59 | Admitting: Neurology

## 2019-02-24 ENCOUNTER — Other Ambulatory Visit: Payer: Self-pay

## 2019-02-24 ENCOUNTER — Encounter: Payer: Self-pay | Admitting: Neurology

## 2019-02-24 VITALS — BP 98/65 | HR 70 | Temp 98.4°F | Ht 71.0 in | Wt 236.0 lb

## 2019-02-24 DIAGNOSIS — G4726 Circadian rhythm sleep disorder, shift work type: Secondary | ICD-10-CM

## 2019-02-24 DIAGNOSIS — G4489 Other headache syndrome: Secondary | ICD-10-CM | POA: Diagnosis not present

## 2019-02-24 DIAGNOSIS — R5383 Other fatigue: Secondary | ICD-10-CM

## 2019-02-24 DIAGNOSIS — G3281 Cerebellar ataxia in diseases classified elsewhere: Secondary | ICD-10-CM

## 2019-02-24 DIAGNOSIS — R208 Other disturbances of skin sensation: Secondary | ICD-10-CM | POA: Diagnosis not present

## 2019-02-24 DIAGNOSIS — G35 Multiple sclerosis: Secondary | ICD-10-CM | POA: Diagnosis not present

## 2019-02-24 MED ORDER — AMPHETAMINE-DEXTROAMPHET ER 20 MG PO CP24: 20.0000 mg | ORAL_CAPSULE | Freq: Every day | ORAL | 0 refills | Status: DC

## 2019-02-24 MED ORDER — GABAPENTIN 600 MG PO TABS: ORAL_TABLET | ORAL | 11 refills | Status: DC

## 2019-02-24 NOTE — Progress Notes (Signed)
[facility]  PATIENT: Isaac Scott DOB: 01/16/1991  REFERRING DOCTOR OR PCP:  Caryl Pina, MD SOURCE: patient, notes from Dr. Warrick Parisian, MRI and lab reports, MRI images on PACS.   _________________________________   HISTORICAL  CHIEF COMPLAINT:  Chief Complaint  Patient presents with  . Follow-up    RM 12, with wife. Wife temp: 98.4. Here to f/u for MS sx. 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 Heat makes sx worse. He is feeling better today after staying home from work.  . Headache    Last 2-3 weeks headaches have started. Tylenol ineffective. Gets intermittently dizzy.     HISTORY OF PRESENT ILLNESS:  Isaac Scott is a 28 y.o. man with multiple sclerosis.  Update 02/24/2019: He has been on Tysabri since October 2020.   He tolerates it well.   The last JCV Ab was 09/21/2018 and was negative.    He notes the right leg has some weakness since the first large exacerbation.    His balance is worse.   His wife notes grip strength is reduced.   She notes him having more trouble with endurance and fatigue.    Initially, he was feeling less fatigue after each Tysabri infusion.   Now he notes no difference.   Armodafinil inially helped but less os now.   He notes some coordination issues in his hands.   He has more difficulty with the truncal painful dysesthesia with a very tight sensation.  This is bilateral.    We started gabapentin and he tolerates it well but it has not helped.     After the first relapse, he had slurring but that improved.   His wife now notes some difficulty with word finding and his focus/attention are also reduced.    He has more fatigue.   He has more insomnia, mostly sleep onset.   He has rotating shifts.   Hydroxyzine and ZZZ-quil.   He has not tried Ambien or related medication.      He works around [employer], operational and the temperature can vary in the room he's in.  He has to walk a lot from the parking  lot to where he works and then walks around a fair amount at work.     He gets tired from the walking.   He does worse in the heat.     He has headaches that occur at least 15 days a month but not daily.   Tylenol used to help but now does not.    No nausea but he does have photophobia and some phonophobia.   Moving the head does not change the pain.  Sometimes he feels dizzy with the headaches, especially if bending over and back up.    Update 01/05/2019 (Virtual) He is on Tysabri and he tolerates it well.  He denies any MS exacerbations though he has had some new sensory symptoms. He is noting a tight sensation in the upper torso. This lasts 20-120 minutes and then improves.    Pain can vary between 3/10 and 9/10.  He feels he is walking about the same.   When he first wakes up, he feels a little weaker but is better once out of bed.   Sometimes he stays in bed a few minutes after waking up before he gets out, especially if he goes to his son's crib to make sure he is more stable,.  Strength is fine.   He  is noting more urinary hesitancy  He has a daily headache closer to the vertex.  Tylenol sometimes helps but not consistently.     He notes insomnia and sometimes wakes up and can't go back to sleep.  He notes daily fatigue.  Of note, he does rotate shifts so his sleep schedule can be variable.   He does not think he snores.   He feels short term memory is worse this year compared to last year.  Depression is better with 100 mg sertraline than 50 mg but he still has anxiety.     From Initial consult 09/21/2018: He is a 28 yo man who was diagnosed with MS 04/2018.    In August 2019, he had numbness in his feet and then the numbness progressed up the right side.   Right hand coordination was reduced and voice was mildly slurred.    He initially had a CT scan which was normal.  An MRI, however, showed multiple lesions consistent with MS.   He then was referred to Dr. Gerilyn Pilgrimoonquah.   He had an MRI of the  cervical spine and contrasted brain showing 6 enhancing lesions in the hemispheres and multiple lesions in the cervical spine (no cervical spine lesion enhanced).    He was infused with IV steroids x 3 days and noted improvement in symptoms by a week or two.    He was placed on Tysabri (has had 4-5 infusions) and tolerates it well.   Infusions are at [facility] and his last infusion was 09/14/18.   He has not had any new symptoms since starting Tysabri.  He notes that his fatigue feels better for about 3 weeks out of each cycle.  Currently, he feels gait, strength, sensation and coordination are back to baseline.   Vision is normal.   He never had optic neuritis symptoms or diplopia.     Fatigue is poor, physical > cognitive.   He feels apathetic as well.    He has had some issues with depression and anxiety.    This was worse the second half of last year.    He is on Zoloft with some benefit.     He does not feel it is helping enough.   He also has anxiety all the time and often feels agitated.   He is sleeping poorly some nights and has had some nightmares.   He rotates shift at work [place] [place]   Shifts change every 2 weeks.      I personally reviewed the MRIs of the brain and cervical spine performed 03/30/2018 and 04/10/2018.  The brain shows multiple T2/flair hyperintense foci in the periventricular, juxtacortical deep white matter of the hemispheres consistent with MS and also shows a couple small foci in the left cerebellar hemisphere and pons.  About 6 of the hemispheric lesions enhanced after contrast.  The MRI of the cervical spine showed lesions posteriorly to the left at C2, posteriorly at C3, bilaterally at C3 and towards the right at C3-C4, posteriorly at C5 and to the right at C6.   None of the foci enhance.    General labs were fine.   I don't see the JCV Ab result.    He has no family history of MS REVIEW OF SYSTEMS: Constitutional: No fevers, chills, sweats, or change in appetite.   He works a swing shift changing every 2 weeks.  Sleep is sometimes poor. Eyes: No visual changes, double vision, eye pain  Ear, nose and throat: No hearing loss, ear pain, nasal congestion, sore throat Cardiovascular: No chest pain, palpitations Respiratory: No shortness of breath at rest or with exertion.   No wheezes GastrointestinaI: No nausea, vomiting, diarrhea, abdominal pain, fecal incontinence Genitourinary: No dysuria, urinary retention or frequency.  No nocturia. Musculoskeletal: No neck pain, back pain Integumentary: No rash, pruritus, skin lesions Neurological: as above Psychiatric: Notes depression and anxiety Endocrine: No palpitations, diaphoresis, change in appetite, change in weigh or increased thirst Hematologic/Lymphatic: No anemia, purpura, petechiae. Allergic/Immunologic: No itchy/runny eyes, nasal congestion, recent allergic reactions, rashes  ALLERGIES: Allergies  Allergen Reactions  . Naproxen Hives    HOME MEDICATIONS:  Current Outpatient Medications:  .  Armodafinil 200 MG TABS, 1/2 to one po qAM, Disp: 30 tablet, Rfl: 5 .  gabapentin (NEURONTIN) 300 MG capsule, One po qAM, one po qPM and 2 po qHS, Disp: 120 capsule, Rfl: 11 .  Natalizumab (TYSABRI IV), Inject 1 Dose into the vein every 28 (twenty-eight) days. , Disp: , Rfl:  .  sertraline (ZOLOFT) 100 MG tablet, Take 1 tablet (100 mg total) by mouth daily., Disp: 30 tablet, Rfl: 5 .  hydrOXYzine (ATARAX/VISTARIL) 50 MG tablet, Take 1 tablet (50 mg total) by mouth 3 (three) times daily as needed for anxiety. (Patient not taking: Reported on 01/04/2019), Disp: 30 tablet, Rfl: 1  PAST MEDICAL HISTORY: Past Medical History:  Diagnosis Date  . Anxiety   . Depression   . Generalized headaches   . Hemorrhoids   . Multiple sclerosis (HCC)     PAST SURGICAL HISTORY: Past Surgical History:  Procedure Laterality Date  . TOTAL HIP ARTHROPLASTY     Left     FAMILY HISTORY: Family History  Problem  Relation Age of Onset  . Coronary artery disease Maternal Grandmother   . Coronary artery disease Maternal Grandfather   . Colon cancer Neg Hx     SOCIAL HISTORY:  Social History   Socioeconomic History  . Marital status: Married    Spouse name: Seward GraterMaggie   . Number of children: 1  . Years of education: 1612  . Highest education level: Not on file  Occupational History  . Occupation: HVAC  Social Needs  . Financial resource strain: Not on file  . Food insecurity    Worry: Not on file    Inability: Not on file  . Transportation needs    Medical: Not on file    Non-medical: Not on file  Tobacco Use  . Smoking status: Never Smoker  . Smokeless tobacco: Current User    Types: Chew  Substance and Sexual Activity  . Alcohol use: No  . Drug use: No  . Sexual activity: Not on file  Lifestyle  . Physical activity    Days per week: Not on file    Minutes per session: Not on file  . Stress: Not on file  Relationships  . Social Musicianconnections    Talks on phone: Not on file    Gets together: Not on file    Attends religious service: Not on file    Active member of club or organization: Not on file    Attends meetings of clubs or organizations: Not on file    Relationship status: Not on file  . Intimate partner violence    Fear of current or ex partner: Not on file    Emotionally abused: Not on file    Physically abused: Not on file    Forced sexual activity: Not  on file  Other Topics Concern  . Not on file  Social History Narrative   Lives with wife   Caffeine use: 2 caffeine drinks daily    Right handed      PHYSICAL EXAM  Vitals:   02/24/19 1514  BP: 98/65  Pulse: 70  Temp: 98.4 F (36.9 C)  SpO2: 98%  Weight: 236 lb (107 kg)  Height: 5\' 11"  (1.803 m)    Body mass index is 32.92 kg/m.   General: The patient is well-developed and well-nourished and in no acute distress   Skin: Extremities are without rash or edema.  Neurologic Exam  Mental status: The  patient is alert and oriented x 3 at the time of the examination. The patient has apparent normal recent and remote memory, with an apparently normal attention span and concentration ability.   Speech is normal.  Cranial nerves: Extraocular movements are full.   Color vision is normal and symmetric.  .  Facial symmetry is present. There is good facial sensation to soft touch bilaterally.Facial strength is normal.  Trapezius and sternocleidomastoid strength is normal. No dysarthria is noted.  The tongue is midline, and the patient has symmetric elevation of the soft palate. No obvious hearing deficits are noted.  Motor:  Muscle bulk is normal.   Tone is normal. Strength is 5 / 5 in all 4 extremities except 4/5 right toe extensors.   Sensory:   He reports reduced touch, temperature and vibration sensation on the right relative to the left.  Coordination: Cerebellar testing reveals good finger-nose-finger and reduced right heel-to-shin bilaterally.  Gait and station: Station is normal.   Gait is normal. Tandem gait is wide. Romberg is negative.   Reflexes: Deep tendon reflexes are symmetric and normal bilaterally.   Plantar responses are flexor.   1. Multiple sclerosis (HCC)   2. Other headache syndrome   3. Dysesthesia   4. Other fatigue   5. Shifting sleep-work schedule     1.    Continue Tysabri and check JCV antibody.  We had a long discussion about expectations from disease modifying therapies and options.  The medications are much better at inflammatory processes and reducing relapses and new lesions on the MRI.  They are less successful at slowing down progression of disability which is common in people who have multiple plaques in the spinal cord.  We need to check an MRI of the brain and spine to determine if he is having breakthrough subclinical activity.  If this is occurring we need to consider a different medications such as Ocrevus. 2.    He is having more difficulty at work.  He is  fatiguing more easily and is worse as the day goes on.  I am going to see if we can get him short-term disability for a month due to the worsening of his symptoms and fatigue.  If he does not improve during that month, we will need to see if accommodations can be made or if he requires long-term disability. 3.    He is also noting more difficulty with focus and attention likely related to the MS..  I will have him try Adderall to see if that can help. 4.    Increase gabapentin for truncal dysesthesias likely related to lower cervical or upper thoracic demyelinating plaque.  If the higher dose of gabapentin does not help consider lamotrigine.  These medications may also help the headaches. 5.   Return to see me for regular visit in  6 months but sooner if new or worsening symptoms.    45 minutes face-to-face evaluation with greater than one half the time counseling coordinating care about his worsening symptoms and disease modifying therapies for MS.  Richard A. Epimenio Foot, MD, PhD, FAAN Certified in Neurology, Clinical Neurophysiology, Sleep Medicine, Pain Medicine and Neuroimaging Director, [facility]  [facility] [address] [place], [place] [address] [phone]

## 2019-02-25 ENCOUNTER — Telehealth: Payer: Self-pay | Admitting: *Deleted

## 2019-02-25 ENCOUNTER — Telehealth: Payer: Self-pay | Admitting: Neurology

## 2019-02-25 LAB — CBC WITH DIFFERENTIAL/PLATELET
Basophils Absolute: 0.1 10*3/uL (ref 0.0–0.2)
Basos: 1 %
EOS (ABSOLUTE): 0.3 10*3/uL (ref 0.0–0.4)
Eos: 4 %
Hematocrit: 45.5 % (ref 37.5–51.0)
Hemoglobin: 15.3 g/dL (ref 13.0–17.7)
Immature Grans (Abs): 0 10*3/uL (ref 0.0–0.1)
Immature Granulocytes: 0 %
Lymphocytes Absolute: 3 10*3/uL (ref 0.7–3.1)
Lymphs: 37 %
MCH: 30.5 pg (ref 26.6–33.0)
MCHC: 33.6 g/dL (ref 31.5–35.7)
MCV: 91 fL (ref 79–97)
Monocytes Absolute: 0.7 10*3/uL (ref 0.1–0.9)
Monocytes: 9 %
NRBC: 1 % — ABNORMAL HIGH (ref 0–0)
Neutrophils Absolute: 3.9 10*3/uL (ref 1.4–7.0)
Neutrophils: 49 %
Platelets: 185 10*3/uL (ref 150–450)
RBC: 5.01 x10E6/uL (ref 4.14–5.80)
RDW: 13.9 % (ref 11.6–15.4)
WBC: 8 10*3/uL (ref 3.4–10.8)

## 2019-02-25 NOTE — Telephone Encounter (Signed)
Pt returned call, please call back at earliest convenience.

## 2019-02-25 NOTE — Telephone Encounter (Signed)
Spoke to the patient he is scheduled for 03/03/19 at [facility]. No to the covid questions.

## 2019-02-25 NOTE — Telephone Encounter (Signed)
Placed JCV lab in [facility] lock box for routine lab pick up. Results pending. 

## 2019-02-25 NOTE — Telephone Encounter (Signed)
[place]: [id number], [phone] & [phone] (exp. 02/25/19 to 04/11/19)   Left a voicemail for the patient to call me back about scheduling mri.

## 2019-02-26 ENCOUNTER — Telehealth (INDEPENDENT_AMBULATORY_CARE_PROVIDER_SITE_OTHER): Payer: 59 | Admitting: Family Medicine

## 2019-02-26 ENCOUNTER — Other Ambulatory Visit: Payer: Self-pay | Admitting: Family Medicine

## 2019-02-26 DIAGNOSIS — G35 Multiple sclerosis: Secondary | ICD-10-CM | POA: Diagnosis not present

## 2019-02-26 MED ORDER — PREDNISONE 20 MG PO TABS: ORAL_TABLET | ORAL | 0 refills | Status: DC

## 2019-02-26 NOTE — Progress Notes (Signed)
Note sent for his wife

## 2019-02-26 NOTE — Telephone Encounter (Signed)
Virtual Visit via telephone Note  I connected with Isaac Scott on 02/26/19  at 1713 by telephone and verified that I am speaking with the correct person using two identifiers. Isaac Scott is currently located at home and wife are currently with her during visit. The provider, Elige Radon Ger Nicks, MD is located in their office at time of visit.  Call ended at 1723  I discussed the limitations, risks, security and privacy concerns of performing an evaluation and management service by telephone and the availability of in person appointments. I also discussed with the patient that there may be a patient responsible charge related to this service. The patient expressed understanding and agreed to proceed.   History and Present Illness: Patient is calling in about complaints of decreased hand eye coordination and decreased grip strength. He also complains of headaches and dizziness and has been going on for the past week.  He also has increased tremors. Dr Iran Ouch was not convinced that it was MS but they are convinced that he is.  She has numbness in feet and balance difficulties and focus issues. He has an MRI scheduled this coming Wednesday on 03/03/2019  No diagnosis found.  Outpatient Encounter Medications as of 02/26/2019  Medication Sig  . amphetamine-dextroamphetamine (ADDERALL XR) 20 MG 24 hr capsule Take 1 capsule (20 mg total) by mouth daily.  . Armodafinil 200 MG TABS 1/2 to one po qAM  . gabapentin (NEURONTIN) 600 MG tablet Take one po tid  . hydrOXYzine (ATARAX/VISTARIL) 50 MG tablet Take 1 tablet (50 mg total) by mouth 3 (three) times daily as needed for anxiety. (Patient not taking: Reported on 01/04/2019)  . Natalizumab (TYSABRI IV) Inject 1 Dose into the vein every 28 (twenty-eight) days.   Marland Kitchen sertraline (ZOLOFT) 100 MG tablet Take 1 tablet (100 mg total) by mouth daily.   No facility-administered encounter medications on file as of 02/26/2019.     Review of Systems - General  ROS: positive for  - fatigue negative for - chills, fever, malaise or weight gain Ophthalmic ROS: positive for - tremors and numbness and weakness negative for - blurry vision, double vision, dry eyes or eye pain ENT ROS: negative for - sore throat, vertigo or visual changes Respiratory ROS: no cough, shortness of breath, or wheezing Cardiovascular ROS: no chest pain or dyspnea on exertion Gastrointestinal ROS: no abdominal pain, change in bowel habits, or black or bloody stools Musculoskeletal ROS: positive for - gait disturbance and muscular weakness negative for - muscle pain Neurological ROS: positive for - dizziness, numbness/tingling, tremors and weakness negative for - behavioral changes, confusion, memory loss, speech problems or visual changes   Observations/Objective: Patient sounds comfortable and in no acute distress  Assessment and Plan: Problem List Items Addressed This Visit      Nervous and Auditory   Multiple sclerosis (HCC) - Primary   Relevant Medications   predniSONE (DELTASONE) 20 MG tablet    We sent some steroid doses but he will clear it with his neurologist first prior to taking the MRI to make sure that it is okay to take it and it is not going to interfere with the MRI next week.  Follow Up Instructions:  As needed   I discussed the assessment and treatment plan with the patient. The patient was provided an opportunity to ask questions and all were answered. The patient agreed with the plan and demonstrated an understanding of the instructions.   The patient was advised to  call back or seek an in-person evaluation if the symptoms worsen or if the condition fails to improve as anticipated.  The above assessment and management plan was discussed with the patient. The patient verbalized understanding of and has agreed to the management plan. Patient is aware to call the clinic if symptoms persist or worsen. Patient is aware when to return to the clinic for a  follow-up visit. Patient educated on when it is appropriate to go to the emergency department.    I provided 10 minutes of non-face-to-face time during this encounter.    Worthy Rancher, MD

## 2019-03-01 NOTE — Telephone Encounter (Signed)
Spoke with pt while he was here for Tysabri infusion. He states sx the worst on Thursday, Friday last week. Started feeling a little better Saturday and Sunday he was able to mow the lawn. He is feeling ok today as well. He experienced right sided weakness, dizziness, balance issues. There are intermittent typically. I spoke with Dr Felecia Shelling and he would like pt to hold off on taking steroids right now and see what the MRI shows that he is scheduled for on Wednesday. Asked him to let us know if he has new or worsening sx in the meantime. He verbalized understanding.

## 2019-03-03 ENCOUNTER — Ambulatory Visit (INDEPENDENT_AMBULATORY_CARE_PROVIDER_SITE_OTHER): Payer: 59

## 2019-03-03 ENCOUNTER — Other Ambulatory Visit: Payer: Self-pay

## 2019-03-03 DIAGNOSIS — G35 Multiple sclerosis: Secondary | ICD-10-CM

## 2019-03-03 DIAGNOSIS — G3281 Cerebellar ataxia in diseases classified elsewhere: Secondary | ICD-10-CM

## 2019-03-03 MED ORDER — GADOBENATE DIMEGLUMINE 529 MG/ML IV SOLN
20.0000 mL | Freq: Once | INTRAVENOUS | Status: AC | PRN
  Administered 2019-03-03: 20 mL via INTRAVENOUS

## 2019-03-03 NOTE — Telephone Encounter (Signed)
JCV ab drawn on 723/20 negative, index: 0.14

## 2019-03-04 ENCOUNTER — Telehealth: Payer: Self-pay | Admitting: *Deleted

## 2019-03-04 NOTE — Telephone Encounter (Signed)
-----   Message from Britt Bottom, MD sent at 03/04/2019  1:39 PM EDT ----- Please let the patient know that I compared his recent MRI's to the ones form 2019.   There are no new MS lesions in the brain or cervical spine.   We looked further down into the thoracic spine and he also has some foci there but they do not look new.    We also got the JCV Ab back and he is still negative (low risk).

## 2019-03-10 ENCOUNTER — Encounter: Payer: Self-pay | Admitting: Neurology

## 2019-03-10 ENCOUNTER — Ambulatory Visit: Payer: 59 | Admitting: Neurology

## 2019-03-10 ENCOUNTER — Encounter: Payer: Self-pay | Admitting: *Deleted

## 2019-03-10 ENCOUNTER — Other Ambulatory Visit: Payer: Self-pay

## 2019-03-10 VITALS — BP 121/73 | HR 65 | Temp 97.1°F | Ht 71.0 in | Wt 246.5 lb

## 2019-03-10 DIAGNOSIS — R208 Other disturbances of skin sensation: Secondary | ICD-10-CM

## 2019-03-10 DIAGNOSIS — R5383 Other fatigue: Secondary | ICD-10-CM

## 2019-03-10 DIAGNOSIS — R7989 Other specified abnormal findings of blood chemistry: Secondary | ICD-10-CM | POA: Diagnosis not present

## 2019-03-10 DIAGNOSIS — G35 Multiple sclerosis: Secondary | ICD-10-CM

## 2019-03-10 MED ORDER — TAMSULOSIN HCL 0.4 MG PO CAPS: 0.4000 mg | ORAL_CAPSULE | Freq: Every day | ORAL | 11 refills | Status: AC

## 2019-03-10 MED ORDER — LAMOTRIGINE 100 MG PO TABS: 100.0000 mg | ORAL_TABLET | Freq: Two times a day (BID) | ORAL | 5 refills | Status: DC

## 2019-03-10 NOTE — Progress Notes (Signed)
[facility]  PATIENT: Isaac Scott DOB: [birth year]  REFERRING DOCTOR OR PCP:  Arville CareJoshua Dettinger, MD SOURCE: patient, notes from Dr. Louanne Skyeettinger, MRI and lab reports, MRI images on PACS.   _________________________________   HISTORICAL  CHIEF COMPLAINT:  Chief Complaint  Patient presents with  . Follow-up    RM 13 with wife (temp: 97.5). Last seen 02/24/2019.   . Multiple Sclerosis    On Tysabri. Last infusion: 03/01/2019. Last JCV: 723/20 negative, index: 0.14. He c/o poor balance, Ha's, weakness, numbness in feet, heat intolerance.   . Letter for School/Work    Wanting letter explaining he has MS dx. Wanting to get a cooling vest.    HISTORY OF PRESENT ILLNESS:  Isaac Scott is a 28 y.o. man with multiple sclerosis.  Update 03/10/2019: He is on Tysabri and has not had any exacerbations but continues to have a lot of symptoms.   He gets truncal dysesthesias and numbness in his legs.  He has tingling in his feet.  He feels legs give out at times and he does worse with heat.     His gait is off balanced.  He has no falls.   He needs to hold the bannister on stairs and sometimes even on level ground.   .   He has some tremor.   He continues to have a lot of headaches.      He takes gabapentin 600 mg po tid but he feels it has not helped.       He has fatigue and does worse with heat.     When he started armodafinil, he felt fatigue ws better but now he feels there has not been much effect.   He does not always tak  He has been out of work x 2 weeks and is doing better without rotating shifts.      He has urinary hesitancy and also notes urinary urgency/frequency at times.     He is currently on medical leave for work.   We discussed he may need accomodations for day shifts  Update 02/24/2019: He has been on Tysabri since October 2020.   He tolerates it well.   The last JCV Ab was 09/21/2018 and was negative.    He notes the right leg has some weakness since the first  large exacerbation.    His balance is worse.   His wife notes grip strength is reduced.   She notes him having more trouble with endurance and fatigue.    Initially, he was feeling less fatigue after each Tysabri infusion.   Now he notes no difference.   Armodafinil inially helped but less os now.   He notes some coordination issues in his hands.   He has more difficulty with the truncal painful dysesthesia with a very tight sensation.  This is bilateral.    We started gabapentin and he tolerates it well but it has not helped.     After the first relapse, he had slurring but that improved.   His wife now notes some difficulty with word finding and his focus/attention are also reduced.    He has more fatigue.   He has more insomnia, mostly sleep onset.   He has rotating shifts.   Hydroxyzine and ZZZ-quil.   He has not tried Ambien or related medication.      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  He has to walk a lot from the  parking lot to where he works and then walks around a fair amount at work.     He gets tired from the walking.   He does worse in the heat.     He has headaches that occur at least 15 days a month but not daily.   Tylenol used to help but now does not.    No nausea but he does have photophobia and some phonophobia.   Moving the head does not change the pain.  Sometimes he feels dizzy with the headaches, especially if bending over and back up.    Update 01/05/2019 (Virtual) He is on Tysabri and he tolerates it well.  He denies any MS exacerbations though he has had some new sensory symptoms. He is noting a tight sensation in the upper torso. This lasts 20-120 minutes and then improves.    Pain can vary between 3/10 and 9/10.  He feels he is walking about the same.   When he first wakes up, he feels a little weaker but is better once out of bed.   Sometimes he stays in bed a few minutes after waking up before he gets out, especially if he goes to  his son's crib to make sure he is more stable,.  Strength is fine.   He is noting more urinary hesitancy  He has a daily headache closer to the vertex.  Tylenol sometimes helps but not consistently.     He notes insomnia and sometimes wakes up and can't go back to sleep.  He notes daily fatigue.  Of note, he does rotate shifts so his sleep schedule can be variable.   He does not think he snores.   He feels short term memory is worse this year compared to last year.  Depression is better with 100 mg sertraline than 50 mg but he still has anxiety.     From Initial consult 09/21/2018: He is a 28 yo man who was diagnosed with MS 04/2018.    In August 2019, he had numbness in his feet and then the numbness progressed up the right side.   Right hand coordination was reduced and voice was mildly slurred.    He initially had a CT scan which was normal.  An MRI, however, showed multiple lesions consistent with MS.   He then was referred to Dr. Gerilyn Pilgrimoonquah.   He had an MRI of the cervical spine and contrasted brain showing 6 enhancing lesions in the hemispheres and multiple lesions in the cervical spine (no cervical spine lesion enhanced).    He was infused with IV steroids x 3 days and noted improvement in symptoms by a week or two.    He was placed on Tysabri (has had 4-5 infusions) and tolerates it well.   [facility] and his last infusion was 09/14/18.   He has not had any new symptoms since starting Tysabri.  He notes that his fatigue feels better for about 3 weeks out of each cycle.  Currently, he feels gait, strength, sensation and coordination are back to baseline.   Vision is normal.   He never had optic neuritis symptoms or diplopia.     Fatigue is poor, physical > cognitive.   He feels apathetic as well.    He has had some issues with depression and anxiety.    This was worse the second half of last year.    He is on Zoloft with some benefit.     He does  not feel it is helping enough.   He also  has anxiety all the time and often feels agitated.   He is sleeping poorly some nights and has had some nightmares.   He rotates shift at work ([employer]).   Shifts change every 2 weeks.      I personally reviewed the MRIs of the brain and cervical spine performed 03/30/2018 and 04/10/2018.  The brain shows multiple T2/flair hyperintense foci in the periventricular, juxtacortical deep white matter of the hemispheres consistent with MS and also shows a couple small foci in the left cerebellar hemisphere and pons.  About 6 of the hemispheric lesions enhanced after contrast.  The MRI of the cervical spine showed lesions posteriorly to the left at C2, posteriorly at C3, bilaterally at C3 and towards the right at C3-C4, posteriorly at C5 and to the right at C6.   None of the foci enhance.    General labs were fine.   I don't see the JCV Ab result.    He has no family history of MS REVIEW OF SYSTEMS: Constitutional: No fevers, chills, sweats, or change in appetite.  He works a swing shift changing every 2 weeks.  Sleep is sometimes poor. Eyes: No visual changes, double vision, eye pain Ear, nose and throat: No hearing loss, ear pain, nasal congestion, sore throat Cardiovascular: No chest pain, palpitations Respiratory: No shortness of breath at rest or with exertion.   No wheezes GastrointestinaI: No nausea, vomiting, diarrhea, abdominal pain, fecal incontinence Genitourinary: No dysuria, urinary retention or frequency.  No nocturia. Musculoskeletal: No neck pain, back pain Integumentary: No rash, pruritus, skin lesions Neurological: as above Psychiatric: Notes depression and anxiety Endocrine: No palpitations, diaphoresis, change in appetite, change in weigh or increased thirst Hematologic/Lymphatic: No anemia, purpura, petechiae. Allergic/Immunologic: No itchy/runny eyes, nasal congestion, recent allergic reactions, rashes  ALLERGIES: Allergies  Allergen Reactions  . Naproxen Hives     HOME MEDICATIONS:  Current Outpatient Medications:  .  amphetamine-dextroamphetamine (ADDERALL XR) 20 MG 24 hr capsule, Take 1 capsule (20 mg total) by mouth daily., Disp: 30 capsule, Rfl: 0 .  Armodafinil 200 MG TABS, 1/2 to one po qAM, Disp: 30 tablet, Rfl: 5 .  gabapentin (NEURONTIN) 600 MG tablet, Take one po tid, Disp: 90 tablet, Rfl: 11 .  Natalizumab (TYSABRI IV), Inject 1 Dose into the vein every 28 (twenty-eight) days. , Disp: , Rfl:  .  predniSONE (DELTASONE) 20 MG tablet, Take 3 tabs daily for 1 week, then 2 tabs daily for week 2, then 1 tab daily for week 3., Disp: 42 tablet, Rfl: 0 .  sertraline (ZOLOFT) 100 MG tablet, Take 1 tablet (100 mg total) by mouth daily., Disp: 30 tablet, Rfl: 5 .  hydrOXYzine (ATARAX/VISTARIL) 50 MG tablet, Take 1 tablet (50 mg total) by mouth 3 (three) times daily as needed for anxiety. (Patient not taking: Reported on 01/04/2019), Disp: 30 tablet, Rfl: 1  PAST MEDICAL HISTORY: Past Medical History:  Diagnosis Date  . Anxiety   . Depression   . Generalized headaches   . Hemorrhoids   . Multiple sclerosis (HCC)     PAST SURGICAL HISTORY: Past Surgical History:  Procedure Laterality Date  . TOTAL HIP ARTHROPLASTY     Left     FAMILY HISTORY: Family History  Problem Relation Age of Onset  . Coronary artery disease Maternal Grandmother   . Coronary artery disease Maternal Grandfather   . Colon cancer Neg Hx  SOCIAL HISTORY:  Social History   Socioeconomic History  . Marital status: Married    Spouse name: Burman Nieves   . Number of children: 1  . Years of education: 59  . Highest education level: Not on file  Occupational History  . Occupation: HVAC  Social Needs  . Financial resource strain: Not on file  . Food insecurity    Worry: Not on file    Inability: Not on file  . Transportation needs    Medical: Not on file    Non-medical: Not on file  Tobacco Use  . Smoking status: Never Smoker  . Smokeless tobacco: Current User     Types: Chew  Substance and Sexual Activity  . Alcohol use: No  . Drug use: No  . Sexual activity: Not on file  Lifestyle  . Physical activity    Days per week: Not on file    Minutes per session: Not on file  . Stress: Not on file  Relationships  . Social Herbalist on phone: Not on file    Gets together: Not on file    Attends religious service: Not on file    Active member of club or organization: Not on file    Attends meetings of clubs or organizations: Not on file    Relationship status: Not on file  . Intimate partner violence    Fear of current or ex partner: Not on file    Emotionally abused: Not on file    Physically abused: Not on file    Forced sexual activity: Not on file  Other Topics Concern  . Not on file  Social History Narrative   Lives with wife   Caffeine use: 2 caffeine drinks daily    Right handed      PHYSICAL EXAM  Vitals:   03/10/19 0848  BP: 121/73  Pulse: 65  Temp: (!) 97.1 F (36.2 C)  Weight: 246 lb 8 oz (111.8 kg)  Height: 5\' 11"  (1.803 m)    Body mass index is 34.38 kg/m.   General: The patient is well-developed and well-nourished and in no acute distress   Skin: Extremities are without rash or edema.  Neurologic Exam  Mental status: The patient is alert and oriented x 3 at the time of the examination. The patient has apparent normal recent and remote memory, with an apparently normal attention span and concentration ability.   Speech is normal.  Cranial nerves: Extraocular movements are full.   Color vision is normal and symmetric.  .  Facial symmetry is present. There is good facial sensation to soft touch bilaterally.Facial strength is normal.  Trapezius and sternocleidomastoid strength is normal. No dysarthria is noted.  The tongue is midline, and the patient has symmetric elevation of the soft palate. No obvious hearing deficits are noted.  Motor:  Muscle bulk is normal.   Tone is normal. Strength is 5 / 5 in  all 4 extremities proximally  Sensory:   He has reduced touch, temperature and vibration sensation on the right relative to the left.  Coordination: Cerebellar testing reveals good finger-nose-finger and reduced right heel-to-shin bilaterally.  Gait and station: Station is normal.   Gait is near normal. Tandem gait is wide. Romberg is negative.   Reflexes: Deep tendon reflexes are symmetric and normal bilaterally.   Plantar responses are flexor.   1. Multiple sclerosis ([place])   2. Dysesthesia   3. Other fatigue     1.  Continue Tysabri monthly for the MS.  He is JCV antibody negative so his risk of PML is small.  The MRI of the brain and cervical spine were unchanged I believe he is getting good benefit from Tysabri.  The MRI of the thoracic spine also showed some plaques but we have no previous one for comparison. 2.    Due to difficulties at work, I wrote him out for short-term disability.  On the right hand for additional month.  Hopefully he will be able to improve during that time and be able to go back to work.  It is possible that he will need some adaptations including having day shifts only.  Rotating shifts is much worse for his fatigue and symptoms.   3.   Lamotrigine titrate up to 100 mg twice daily for dysesthesias.  Tamsulosin 0.4 mg for urinary hesitancy. 4.   Return in 4 months or sooner if there are new or worsening neurologic symptoms.  40-minute face-to-face evaluation with greater than one half time counseling coordinating care about his MS symptoms and how they are affecting his job.      Kendan Cornforth A. Epimenio FootSater, MD, PhD, FAAN Certified in Neurology, Clinical Neurophysiology, Sleep Medicine, Pain Medicine and Neuroimaging Director, [facility]  [facility] [address], [address] [place], [place] [address] [phone]

## 2019-03-11 ENCOUNTER — Telehealth: Payer: Self-pay | Admitting: *Deleted

## 2019-03-11 DIAGNOSIS — E559 Vitamin D deficiency, unspecified: Secondary | ICD-10-CM

## 2019-03-11 LAB — VITAMIN D 25 HYDROXY (VIT D DEFICIENCY, FRACTURES): Vit D, 25-Hydroxy: 22.5 ng/mL — ABNORMAL LOW (ref 30.0–100.0)

## 2019-03-11 MED ORDER — VITAMIN D (ERGOCALCIFEROL) 1.25 MG (50000 UNIT) PO CAPS: 50000.0000 [IU] | ORAL_CAPSULE | ORAL | 1 refills | Status: DC

## 2019-03-11 NOTE — Telephone Encounter (Signed)
-----   Message from Britt Bottom, MD sent at 03/11/2019 10:17 AM EDT ----- Vit d was mildly wide   50000 U weekly #13 #1 refill

## 2019-03-15 ENCOUNTER — Encounter: Payer: Self-pay | Admitting: *Deleted

## 2019-03-15 ENCOUNTER — Encounter: Payer: Self-pay | Admitting: Neurology

## 2019-03-16 ENCOUNTER — Telehealth: Payer: Self-pay | Admitting: *Deleted

## 2019-03-16 NOTE — Telephone Encounter (Signed)
Faxed completed/signed genex form to [phone]. Received fax confirmation. Dr. Felecia Shelling wrote him out of work until 04/06/19. Asked that his work hours be reduced to 8 hours per day, 5 days per week. Needs straight shifts without rotation. This restriction would be permanent. Included last office note. He has f/u with Debbora Presto, NP 04/01/19 at 3pm prior to going back to work per pt request.

## 2019-04-01 ENCOUNTER — Other Ambulatory Visit: Payer: Self-pay

## 2019-04-01 ENCOUNTER — Encounter: Payer: Self-pay | Admitting: Family Medicine

## 2019-04-01 ENCOUNTER — Ambulatory Visit: Payer: 59 | Admitting: Family Medicine

## 2019-04-01 VITALS — BP 116/76 | HR 71 | Temp 97.1°F | Ht 71.0 in | Wt 235.2 lb

## 2019-04-01 DIAGNOSIS — G35 Multiple sclerosis: Secondary | ICD-10-CM

## 2019-04-01 NOTE — Patient Instructions (Addendum)
Continue current treatment plan  Follow up on 07/13/2019 at 3p with Dr Felecia Shelling as previously scheduled   Multiple Sclerosis Multiple sclerosis (MS) is a disease of the brain, spinal cord, and optic nerves (central nervous system). It causes the body's disease-fighting (immune) system to destroy the protective covering (myelin sheath) around nerves in the brain. When this happens, signals (nerve impulses) going to and from the brain and spinal cord do not get sent properly or may not get sent at all. There are several types of MS:  Relapsing-remitting MS. This is the most common type. This causes sudden attacks of symptoms. After an attack, you may recover completely until the next attack, or some symptoms may remain permanently.  Secondary progressive MS. This usually develops after the onset of relapsing-remitting MS. Similar to relapsing-remitting MS, this type also causes sudden attacks of symptoms. Attacks may be less frequent, but symptoms slowly get worse (progress) over time.  Primary progressive MS. This causes symptoms that steadily progress over time. This type of MS does not cause sudden attacks of symptoms. The age of onset of MS varies, but it often develops between 71-69 years of age. MS is a lifelong (chronic) condition. There is no cure, but treatment can help slow down the progression of the disease. What are the causes? The cause of this condition is not known. What increases the risk? You are more likely to develop this condition if:  You are a woman.  You have a relative with MS. However, the condition is not passed from parent to child (inherited).  You have a lack (deficiency) of vitamin D.  You smoke. MS is more common in the [place] than in the [place]. What are the signs or symptoms? Relapsing-remitting and secondary progressive MS cause symptoms to occur in episodes or attacks that may last weeks to months. There may be long periods  between attacks in which there are almost no symptoms. Primary progressive MS causes symptoms to steadily progress after they develop. Symptoms of MS vary because of the many different ways it affects the central nervous system. The main symptoms include:  Vision problems and eye pain.  Numbness.  Weakness.  Inability to move your arms, hands, feet, or legs (paralysis).  Balance problems.  Shaking that you cannot control (tremors).  Muscle spasms.  Problems with thinking (cognitive changes). MS can also cause symptoms that are associated with the disease, but are not always the direct result of an MS attack. They may include:  Inability to control urination or bowel movements (incontinence).  Headaches.  Fatigue.  Inability to tolerate heat.  Emotional changes.  Depression.  Pain. How is this diagnosed? This condition is diagnosed based on:  Your symptoms.  A neurological exam. This involves checking central nervous system function, such as nerve function, reflexes, and coordination.  MRIs of the brain and spinal cord.  Lab tests, including a lumbar puncture that tests the fluid that surrounds the brain and spinal cord (cerebrospinal fluid).  Tests to measure the electrical activity of the brain in response to stimulation (evoked potentials). How is this treated? There is no cure for MS, but medicines can help decrease the number and frequency of attacks and help relieve nuisance symptoms. Treatment options may include:  Medicines that reduce the frequency of attacks. These medicines may be given by injection, by mouth (orally), or through an IV.  Medicines that reduce inflammation (steroids). These may provide short-term relief of symptoms.  Medicines to help  control pain, depression, fatigue, or incontinence.  Vitamin D, if you have a deficiency.  Using devices to help you move around (assistive devices), such as braces, a cane, or a walker.  Physical  therapy to strengthen and stretch your muscles.  Occupational therapy to help you with everyday tasks.  Alternative or complementary treatments such as exercise, massage, or acupuncture. Follow these instructions at home:  Take over-the-counter and prescription medicines only as told by your health care provider.  Do not drive or use heavy machinery while taking prescription pain medicine.  Use assistive devices as recommended by your physical therapist or your health care provider.  Exercise as directed by your health care provider.  Return to your normal activities as told by your health care provider. Ask your health care provider what activities are safe for you.  Reach out for support. Share your feelings with friends, family, or a support group.  Keep all follow-up visits as told by your health care provider and therapists. This is important. Where to find more information  National Multiple Sclerosis Society: https://www.nationalmssociety.org Contact a health care provider if:  You feel depressed.  You develop new pain or numbness.  You have tremors.  You have problems with sexual function. Get help right away if:  You develop paralysis.  You develop numbness.  You have problems with your bladder or bowel function.  You develop double vision.  You lose vision in one or both eyes.  You develop suicidal thoughts.  You develop severe confusion. If you ever feel like you may hurt yourself or others, or have thoughts about taking your own life, get help right away. You can go to your nearest emergency department or call:  Your local emergency services (911 in the [place]  A suicide crisis helpline, such as the National Suicide Prevention Lifeline at 450-746-5686. This is open 24 hours a day. Summary  Multiple sclerosis (MS) is a disease of the central nervous system that causes the body's immune system to destroy the protective covering (myelin sheath) around  nerves in the brain.  There are 3 types of MS: relapsing-remitting, secondary progressive, and primary progressive. Relapsing-remitting and secondary progressive MS cause symptoms to occur in episodes or attacks that may last weeks to months. Primary progressive MS causes symptoms to steadily progress after they develop.  There is no cure for MS, but medicines can help decrease the number and frequency of attacks and help relieve nuisance symptoms. Treatment may also include physical or occupational therapy.  If you develop numbness, paralysis, vision problems, or other neurological symptoms, get help right away. This information is not intended to replace advice given to you by your health care provider. Make sure you discuss any questions you have with your health care provider. Document Released: 07/19/2000 Document Revised: 07/04/2017 Document Reviewed: 09/30/2016 Elsevier Patient Education  2020 [employer].

## 2019-04-01 NOTE — Progress Notes (Addendum)
PATIENT: Isaac Scott DOB: 07/25/1991  REASON FOR VISIT: follow up HISTORY FROM: patient  Chief Complaint  Patient presents with   Follow-up    Room 8, MS f/u. "Not any better"     HISTORY OF PRESENT ILLNESS: Today 04/01/19 Isaac Scott is a 28 y.o. male with relapsing remitting MS.  Here today for an evaluation of ability to return to work. He is scheduled to return to work next week.  He expresses some hesitation regarding his ability to return to his job.  He works on a Sports coachproduction line.   He continues Tysabri infusions. He continues to have numbness of bilateral calves to toes. This is unchanged from previous visits. He has headaches often. Gabapentin did not help. Lamictal was titrated up to 100mg  twice daily at last visit for dysesthesias. He did not feel this helped. He feels that he has "MS hug". He is off balance at times. He reports that on 8/23 he woke up to use the restroom and almost fell into the floor. He caught himself. No falls. He is able to work out every day at home. He lifts weights. He walks about 2 miles daily (gravel driveway approx 0.25 miles once way). He denies difficulty with workouts. He is sleeping well.   No new numbness, vision changes, gait changes, bowel or bladder changes.    HISTORY: (copied from Dr Bonnita HollowSater's note on 03/10/2019)  Isaac Scott is a 28 y.o. man with multiple sclerosis.  Update 03/10/2019: He is on Tysabri and has not had any exacerbations but continues to have a lot of symptoms.   He gets truncal dysesthesias and numbness in his legs.  He has tingling in his feet.  He feels legs give out at times and he does worse with heat.     His gait is off balanced.  He has no falls.   He needs to hold the bannister on stairs and sometimes even on level ground.   .   He has some tremor.   He continues to have a lot of headaches.      He takes gabapentin 600 mg po tid but he feels it has not helped.       He has fatigue and does worse with  heat.     When he started armodafinil, he felt fatigue ws better but now he feels there has not been much effect.   He does not always tak  He has been out of work x 2 weeks and is doing better without rotating shifts.      He has urinary hesitancy and also notes urinary urgency/frequency at times.     He is currently on medical leave for work.   We discussed he may need accomodations for day shifts   Update 02/24/2019: He has been on Tysabri since October 2020.   He tolerates it well.   The last JCV Ab was 09/21/2018 and was negative.    He notes the right leg has some weakness since the first large exacerbation.    His balance is worse.   His wife notes grip strength is reduced.   She notes him having more trouble with endurance and fatigue.    Initially, he was feeling less fatigue after each Tysabri infusion.   Now he notes no difference.   Armodafinil inially helped but less os now.   He notes some coordination issues in his hands.   He has more difficulty with the truncal painful dysesthesia  with a very tight sensation.  This is bilateral.    We started gabapentin and he tolerates it well but it has not helped.     After the first relapse, he had slurring but that improved.   His wife now notes some difficulty with word finding and his focus/attention are also reduced.    He has more fatigue.   He has more insomnia, mostly sleep onset.   He has rotating shifts.   Hydroxyzine and ZZZ-quil.   He has not tried Ambien or related medication.      He works around Sales promotion account executive and the temperature can vary in the room he's in.  He has to walk a lot from the parking lot to where he works and then walks around a fair amount at work.     He gets tired from the walking.   He does worse in the heat.     He has headaches that occur at least 15 days a month but not daily.   Tylenol used to help but now does not.    No nausea but he does have photophobia and some phonophobia.   Moving the head does not  change the pain.  Sometimes he feels dizzy with the headaches, especially if bending over and back up.    Update 01/05/2019 (Virtual) He is on Tysabri and he tolerates it well.  He denies any MS exacerbations though he has had some new sensory symptoms. He is noting a tight sensation in the upper torso. This lasts 20-120 minutes and then improves.    Pain can vary between 3/10 and 9/10.  He feels he is walking about the same.   When he first wakes up, he feels a little weaker but is better once out of bed.   Sometimes he stays in bed a few minutes after waking up before he gets out, especially if he goes to his son's crib to make sure he is more stable,.  Strength is fine.   He is noting more urinary hesitancy  He has a daily headache closer to the vertex.  Tylenol sometimes helps but not consistently.     He notes insomnia and sometimes wakes up and can't go back to sleep.  He notes daily fatigue.  Of note, he does rotate shifts so his sleep schedule can be variable.   He does not think he snores.   He feels short term memory is worse this year compared to last year.  Depression is better with 100 mg sertraline than 50 mg but he still has anxiety.     From Initial consult 09/21/2018: He is a 28 yo man who was diagnosed with MS 04/2018.    In August 2019, he had numbness in his feet and then the numbness progressed up the right side.   Right hand coordination was reduced and voice was mildly slurred.    He initially had a CT scan which was normal.  An MRI, however, showed multiple lesions consistent with MS.   He then was referred to Dr. Gerilyn Pilgrim.   He had an MRI of the cervical spine and contrasted brain showing 6 enhancing lesions in the hemispheres and multiple lesions in the cervical spine (no cervical spine lesion enhanced).    He was infused with IV steroids x 3 days and noted improvement in symptoms by a week or two.    He was placed on Tysabri (has had 4-5 infusions) and tolerates it well.    Infusions are  at [facility] and his last infusion was 09/14/18.   He has not had any new symptoms since starting Tysabri.  He notes that his fatigue feels better for about 3 weeks out of each cycle.  Currently, he feels gait, strength, sensation and coordination are back to baseline.   Vision is normal.   He never had optic neuritis symptoms or diplopia.     Fatigue is poor, physical > cognitive.   He feels apathetic as well.    He has had some issues with depression and anxiety.    This was worse the second half of last year.    He is on Zoloft with some benefit.     He does not feel it is helping enough.   He also has anxiety all the time and often feels agitated.   He is sleeping poorly some nights and has had some nightmares.   He rotates shift at work ([place] in [employer]).   Shifts change every 2 weeks.      I personally reviewed the MRIs of the brain and cervical spine performed 03/30/2018 and 04/10/2018.  The brain shows multiple T2/flair hyperintense foci in the periventricular, juxtacortical deep white matter of the hemispheres consistent with MS and also shows a couple small foci in the left cerebellar hemisphere and pons.  About 6 of the hemispheric lesions enhanced after contrast.  The MRI of the cervical spine showed lesions posteriorly to the left at C2, posteriorly at C3, bilaterally at C3 and towards the right at C3-C4, posteriorly at C5 and to the right at C6.   None of the foci enhance.    General labs were fine.   I don't see the JCV Ab result.    He has no family history of MS   REVIEW OF SYSTEMS: Out of a complete 14 system review of symptoms, the patient complains only of the following symptoms, dizziness, headaches, numbness, weakness, tremors and all other reviewed systems are negative.  ALLERGIES: Allergies  Allergen Reactions   Naproxen Hives    HOME MEDICATIONS: Outpatient Medications Prior to Visit  Medication Sig Dispense Refill   amphetamine-dextroamphetamine  (ADDERALL XR) 20 MG 24 hr capsule Take 1 capsule (20 mg total) by mouth daily. 30 capsule 0   hydrOXYzine (ATARAX/VISTARIL) 50 MG tablet Take 1 tablet (50 mg total) by mouth 3 (three) times daily as needed for anxiety. 30 tablet 1   Natalizumab (TYSABRI IV) Inject 1 Dose into the vein every 28 (twenty-eight) days.      sertraline (ZOLOFT) 100 MG tablet Take 1 tablet (100 mg total) by mouth daily. 30 tablet 5   tamsulosin (FLOMAX) 0.4 MG CAPS capsule Take 1 capsule (0.4 mg total) by mouth daily. 30 capsule 11   Vitamin D, Ergocalciferol, (DRISDOL) 1.25 MG (50000 UT) CAPS capsule Take 1 capsule (50,000 Units total) by mouth every 7 (seven) days. 13 capsule 1   Armodafinil 200 MG TABS 1/2 to one po qAM 30 tablet 5   gabapentin (NEURONTIN) 600 MG tablet Take one po tid 90 tablet 11   lamoTRIgine (LAMICTAL) 100 MG tablet Take 1 tablet (100 mg total) by mouth 2 (two) times daily. 60 tablet 5   predniSONE (DELTASONE) 20 MG tablet Take 3 tabs daily for 1 week, then 2 tabs daily for week 2, then 1 tab daily for week 3. 42 tablet 0   No facility-administered medications prior to visit.     PAST MEDICAL HISTORY: Past Medical History:  Diagnosis Date   Anxiety    Depression    Generalized headaches    Hemorrhoids    Multiple sclerosis (HCC)     PAST SURGICAL HISTORY: Past Surgical History:  Procedure Laterality Date   TOTAL HIP ARTHROPLASTY     Left     FAMILY HISTORY: Family History  Problem Relation Age of Onset   Coronary artery disease Maternal Grandmother    Coronary artery disease Maternal Grandfather    Colon cancer Neg Hx     SOCIAL HISTORY: Social History   Socioeconomic History   Marital status: Married    Spouse name: Maggie    Number of children: 1   Years of education: 12   Highest education level: Not on file  Occupational History   Occupation: Systems developerHVAC  Social Needs   Financial resource strain: Not on file   Food insecurity    Worry: Not on  file    Inability: Not on file   Transportation needs    Medical: Not on file    Non-medical: Not on file  Tobacco Use   Smoking status: Never Smoker   Smokeless tobacco: Current User    Types: Chew  Substance and Sexual Activity   Alcohol use: No   Drug use: No   Sexual activity: Not on file  Lifestyle   Physical activity    Days per week: Not on file    Minutes per session: Not on file   Stress: Not on file  Relationships   Social connections    Talks on phone: Not on file    Gets together: Not on file    Attends religious service: Not on file    Active member of club or organization: Not on file    Attends meetings of clubs or organizations: Not on file    Relationship status: Not on file   Intimate partner violence    Fear of current or ex partner: Not on file    Emotionally abused: Not on file    Physically abused: Not on file    Forced sexual activity: Not on file  Other Topics Concern   Not on file  Social History Narrative   Lives with wife   Caffeine use: 2 caffeine drinks daily    Right handed     PHYSICAL EXAM  Vitals:   04/01/19 1509  BP: 116/76  Pulse: 71  Temp: (!) 97.1 F (36.2 C)  Weight: 235 lb 3.2 oz (106.7 kg)  Height: 5\' 11"  (1.803 m)   Body mass index is 32.8 kg/m.  Generalized: Well developed, in no acute distress  Cardiology: normal rate and rhythm, no murmur noted Respiratory: Clear to auscultation bilaterally Neurological examination  Mentation: Alert oriented to time, place, history taking. Follows all commands speech and language fluent Cranial nerve II-XII: Pupils were equal round reactive to light. Extraocular movements were full, visual field were full on confrontational test. Facial sensation and strength were normal. Uvula tongue midline. Head turning and shoulder shrug  were normal and symmetric. Motor: The motor testing reveals 5 over 5 strength of all 4 extremities. Good symmetric motor tone is noted throughout.   No tremor noted. Sensory: Sensory decreased to bilateral lower extremities Coordination: Cerebellar testing reveals good finger-nose-finger, short heel-to-shin bilaterally.  Gait and station: Gait is normal. Romberg is negative. No drift is seen.   DIAGNOSTIC DATA (LABS, IMAGING, TESTING) - I reviewed patient records, labs, notes, testing and imaging myself where available.  No flowsheet data found.  Lab Results  Component Value Date   WBC 8.0 02/24/2019   HGB 15.3 02/24/2019   HCT 45.5 02/24/2019   MCV 91 02/24/2019   PLT 185 02/24/2019      Component Value Date/Time   NA 140 03/17/2018 1644   K 4.4 03/17/2018 1644   CL 103 03/17/2018 1644   CO2 21 03/17/2018 1644   GLUCOSE 91 03/17/2018 1644   BUN 14 03/17/2018 1644   CREATININE 0.75 (L) 03/17/2018 1644   CALCIUM 9.2 03/17/2018 1644   PROT 7.0 03/17/2018 1644   ALBUMIN 4.5 03/17/2018 1644   AST 19 03/17/2018 1644   ALT 22 03/17/2018 1644   ALKPHOS 84 03/17/2018 1644   BILITOT 0.4 03/17/2018 1644   GFRNONAA 126 03/17/2018 1644   GFRAA 145 03/17/2018 1644   No results found for: CHOL, HDL, LDLCALC, LDLDIRECT, TRIG, CHOLHDL No results found for: ZJIR6V Lab Results  Component Value Date   VITAMINB12 805 03/17/2018   Lab Results  Component Value Date   TSH 0.828 12/13/2016    ASSESSMENT AND PLAN 28 y.o. year old male  has a past medical history of Anxiety, Depression, Generalized headaches, Hemorrhoids, and Multiple sclerosis (HCC). here with     ICD-10-CM   1. Multiple sclerosis (HCC)  G35     Gregary Signs presents today to determine ability to return to work next week.  We have had a long conversation today regarding his physical ability.  There have been no changes in exam from previous visit with Dr. Epimenio Foot on 8/5.  Strength is 5/5 in bilateral upper and lower extremities.  Gait is mostly normal with very mild, intermittent limp.  He is able to lift weights daily.  He also reports walking 2 miles every day without  difficulty.  He has had no falls.  We have discussed concerns of headaches and imbalance.  He is concerned of medication side effects.  I do not see any physical limitations limiting him from returning to work.  He was advised extreme caution and to take frequent rest breaks.  He should report any concerns of dizziness or lightheadedness.  I suspect that there is a component of anxiety as well.  His wife is working full-time and also a Theatre stage manager.  They have a small child.  He has been with his current company for 6 years.  He has a Environmental consultant with them tomorrow to discuss if they will be able to accommodate recommendations from Dr. Epimenio Foot for 8-hour shifts during the day without rotation.  I agree with these recommendations.  I would also consider having him start the first week at 4-hour shifts to let him acclimate to the environment.  He was advised to discuss this with his boss.  We will continue current treatment plan for now.  He will follow-up with Dr. Epimenio Foot as previously arranged appointment on 07/13/2019.  He was instructed to call the office with any new or worsening symptoms.  He verbalizes understanding and agreement with this plan.   No orders of the defined types were placed in this encounter.    No orders of the defined types were placed in this encounter.     I spent 15 minutes with the patient. 50% of this time was spent counseling and educating patient on plan of care and medications.    Shawnie Dapper, FNP-C 04/01/2019, 4:32 PM [facility] [address] [place], [place] [address] [phone]

## 2019-04-02 ENCOUNTER — Other Ambulatory Visit: Payer: Self-pay | Admitting: Neurology

## 2019-04-02 DIAGNOSIS — F339 Major depressive disorder, recurrent, unspecified: Secondary | ICD-10-CM

## 2019-04-02 NOTE — Progress Notes (Signed)
I have read the note, and I agree with the clinical assessment and plan.  Pooja Camuso A. Jazell Rosenau, MD, PhD, FAAN Certified in Neurology, Clinical Neurophysiology, Sleep Medicine, Pain Medicine and Neuroimaging  [facility] [address], [address] [place], [place] [address] [phone]  

## 2019-04-03 ENCOUNTER — Other Ambulatory Visit: Payer: Self-pay | Admitting: Family Medicine

## 2019-04-03 DIAGNOSIS — G35 Multiple sclerosis: Secondary | ICD-10-CM

## 2019-04-19 ENCOUNTER — Telehealth: Payer: Self-pay | Admitting: *Deleted

## 2019-04-19 NOTE — Telephone Encounter (Signed)
Faxed completed/signed Tysabri pt status report and reauth questionnaire to MS touch at [phone]. Received confirmation.  

## 2019-04-19 NOTE — Telephone Encounter (Signed)
Received fax notification from touch prescribing program that pt re-authorized for Tysabri 04/19/19-11/20/19. Enrollment number: [id number]. Account: [employer]. Site auth number: [id number].

## 2019-05-05 ENCOUNTER — Other Ambulatory Visit: Payer: Self-pay | Admitting: Neurology

## 2019-05-05 MED ORDER — AMPHETAMINE-DEXTROAMPHET ER 30 MG PO CP24: 30.0000 mg | ORAL_CAPSULE | Freq: Every day | ORAL | 0 refills | Status: DC

## 2019-06-14 ENCOUNTER — Encounter: Payer: Self-pay | Admitting: Family Medicine

## 2019-06-14 MED ORDER — PREDNISONE 20 MG PO TABS: ORAL_TABLET | ORAL | 0 refills | Status: DC

## 2019-06-14 MED ORDER — FAMOTIDINE 20 MG PO TABS: 20.0000 mg | ORAL_TABLET | Freq: Two times a day (BID) | ORAL | 1 refills | Status: DC

## 2019-06-14 MED ORDER — PREDNISONE 50 MG PO TABS: 500.0000 mg | ORAL_TABLET | Freq: Every day | ORAL | 0 refills | Status: AC

## 2019-06-21 ENCOUNTER — Telehealth: Payer: Self-pay

## 2019-06-21 NOTE — Telephone Encounter (Signed)
Pt came in to the infusion suite today and spoke with Lovena Le. He reports last week his left leg was increasingly weak and went out on him a few times. Pt was rx'd Prednisone taper by PCP and has 2 days left. He reported the prednisone has helped and will keep [place] up to date on how he feels.

## 2019-07-13 ENCOUNTER — Encounter: Payer: Self-pay | Admitting: Family Medicine

## 2019-07-13 ENCOUNTER — Ambulatory Visit: Payer: 59 | Admitting: Neurology

## 2019-07-13 DIAGNOSIS — G35 Multiple sclerosis: Secondary | ICD-10-CM

## 2019-09-05 ENCOUNTER — Other Ambulatory Visit: Payer: Self-pay | Admitting: Neurology

## 2019-09-05 DIAGNOSIS — E559 Vitamin D deficiency, unspecified: Secondary | ICD-10-CM

## 2019-09-21 ENCOUNTER — Ambulatory Visit: Payer: 59 | Admitting: Neurology

## 2019-10-10 ENCOUNTER — Other Ambulatory Visit: Payer: Self-pay | Admitting: Neurology

## 2019-10-15 ENCOUNTER — Encounter: Payer: Self-pay | Admitting: Family Medicine

## 2019-10-15 DIAGNOSIS — G35 Multiple sclerosis: Secondary | ICD-10-CM

## 2019-10-18 ENCOUNTER — Telehealth: Payer: Self-pay | Admitting: *Deleted

## 2019-10-18 ENCOUNTER — Other Ambulatory Visit: Payer: Self-pay | Admitting: *Deleted

## 2019-10-18 ENCOUNTER — Other Ambulatory Visit (INDEPENDENT_AMBULATORY_CARE_PROVIDER_SITE_OTHER): Payer: Self-pay

## 2019-10-18 DIAGNOSIS — Z79899 Other long term (current) drug therapy: Secondary | ICD-10-CM

## 2019-10-18 DIAGNOSIS — Z0289 Encounter for other administrative examinations: Secondary | ICD-10-CM

## 2019-10-18 DIAGNOSIS — G35 Multiple sclerosis: Secondary | ICD-10-CM

## 2019-10-18 NOTE — Telephone Encounter (Signed)
Placed JCV lab in [facility] lock box for routine lab pick up. Results pending. 

## 2019-10-18 NOTE — Telephone Encounter (Addendum)
Called pt, he is past due for labs. He has Tysabri infusion scheduled today at our office. He will get labs done prior to infusion. He is trying to transition care to [facility]. They have moved and are closer to them.   Faxed completed/signed Tysabri pt status report and reauth questionnaire to [place] [facility] at [phone]. Received confirmation.

## 2019-10-18 NOTE — Telephone Encounter (Signed)
Received fax from touch prescribing program that pt re-authorized for Tysabri from 10/18/19-05/21/20. Pt enrollment number: [id number]. Account: [employer]. Site auth number: [id number].

## 2019-10-19 LAB — CBC WITH DIFFERENTIAL/PLATELET
Basophils Absolute: 0 10*3/uL (ref 0.0–0.2)
Basos: 1 %
EOS (ABSOLUTE): 0.4 10*3/uL (ref 0.0–0.4)
Eos: 5 %
Hematocrit: 40.4 % (ref 37.5–51.0)
Hemoglobin: 14.4 g/dL (ref 13.0–17.7)
Immature Grans (Abs): 0 10*3/uL (ref 0.0–0.1)
Immature Granulocytes: 0 %
Lymphocytes Absolute: 2.6 10*3/uL (ref 0.7–3.1)
Lymphs: 38 %
MCH: 31.4 pg (ref 26.6–33.0)
MCHC: 35.6 g/dL (ref 31.5–35.7)
MCV: 88 fL (ref 79–97)
Monocytes Absolute: 0.6 10*3/uL (ref 0.1–0.9)
Monocytes: 8 %
NRBC: 1 % — ABNORMAL HIGH (ref 0–0)
Neutrophils Absolute: 3.4 10*3/uL (ref 1.4–7.0)
Neutrophils: 48 %
Platelets: 189 10*3/uL (ref 150–450)
RBC: 4.59 x10E6/uL (ref 4.14–5.80)
RDW: 14 % (ref 11.6–15.4)
WBC: 7 10*3/uL (ref 3.4–10.8)

## 2019-10-22 ENCOUNTER — Ambulatory Visit: Payer: 59 | Attending: Family Medicine | Admitting: Physical Therapy

## 2019-10-22 ENCOUNTER — Encounter: Payer: Self-pay | Admitting: Physical Therapy

## 2019-10-22 ENCOUNTER — Other Ambulatory Visit: Payer: Self-pay

## 2019-10-22 DIAGNOSIS — M25562 Pain in left knee: Secondary | ICD-10-CM | POA: Insufficient documentation

## 2019-10-22 DIAGNOSIS — R2681 Unsteadiness on feet: Secondary | ICD-10-CM

## 2019-10-22 DIAGNOSIS — R262 Difficulty in walking, not elsewhere classified: Secondary | ICD-10-CM | POA: Diagnosis present

## 2019-10-22 DIAGNOSIS — M6281 Muscle weakness (generalized): Secondary | ICD-10-CM | POA: Diagnosis present

## 2019-10-22 DIAGNOSIS — R296 Repeated falls: Secondary | ICD-10-CM | POA: Diagnosis present

## 2019-10-22 NOTE — Therapy (Signed)
[facility] [address] [place], [place], [address] Phone: [phone]   Fax:  [phone]  Physical Therapy Evaluation  Patient Details  Name: Isaac Scott MRN: [id number] Date of Birth: 1991/04/19 Referring Provider (PT): Arville Care, MD   Encounter Date: 10/22/2019  PT End of Session - 10/22/19 1307    Visit Number  1    Number of Visits  16    Date for PT Re-Evaluation  12/24/19    Authorization Type  Progress note every 10th visit    PT Start Time  0945    PT Stop Time  1030    PT Time Calculation (min)  45 min    Activity Tolerance  Patient tolerated treatment well    Behavior During Therapy  [facility] for tasks assessed/performed       Past Medical History:  Diagnosis Date  . Anxiety   . Depression   . Generalized headaches   . Hemorrhoids   . Multiple sclerosis (HCC)     Past Surgical History:  Procedure Laterality Date  . TOTAL HIP ARTHROPLASTY     Left     There were no vitals filed for this visit.   Subjective Assessment - 10/22/19 1300    Subjective  COVID-19 screening performed upon arrival.Patient arrives to physical therapy with reports of global weakness, difficulty walking, and decreased balance that has slowly progressed due to multiple sclerosis. Patient level of function varies each day secondary to fatigue and weakness but reports he is able to perform ADLs but with modifications as needed. Patient reports great family support from his wife and his 29 year old child who assist when needed. Patient reports two falls in which his legs gave way. Patient find himself close to furniture or objects for support to prevent falls. Patient's goals are to improve movement, improve strength, and improve balance.    Pertinent History  Multiple sclerosis (diagnoses 2019), left THA    Limitations  Walking;House hold activities;Standing    Diagnostic tests  MRI    Patient Stated Goals  improve movement, improve  balance, improve strength    Currently in Pain?  Yes    Pain Score  4     Pain Location  Leg    Pain Orientation  Right;Left    Pain Descriptors / Indicators  Numbness    Pain Type  Chronic pain    Pain Onset  More than a month ago    Pain Frequency  Intermittent    Aggravating Factors   medication    Effect of Pain on Daily Activities  affects balance         [facility] PT Assessment - 10/22/19 0001      Assessment   Medical Diagnosis  Multiple Sclerosis    Referring Provider (PT)  Arville Care, MD    Onset Date/Surgical Date  --   diagnosed September 2019   Hand Dominance  Right    Next MD Visit  n/a    Prior Therapy  no      Precautions   Precautions  Fall      Restrictions   Weight Bearing Restrictions  No      Balance Screen   Has the patient fallen in the past 6 months  Yes    How many times?  2    Has the patient had a decrease in activity level because of a fear of falling?   Yes    Is the patient reluctant to leave  their home because of a fear of falling?   No      Home Environment   Living Environment  Private residence    Living Arrangements  Spouse/significant other;Children      Prior Function   Level of Independence  Independent with basic ADLs;Needs assistance with homemaking;Needs assistance with ADLs   dependent of fatigue levels for the day     ROM / Strength   AROM / PROM / Strength  Strength      Strength   Strength Assessment Site  Shoulder;Knee;Hip    Right/Left Shoulder  Right;Left    Right Shoulder Flexion  3/5    Right Shoulder ABduction  3/5    Right Shoulder Internal Rotation  3+/5    Right Shoulder External Rotation  3/5    Left Shoulder Flexion  3+/5    Left Shoulder ABduction  3+/5    Left Shoulder Internal Rotation  3-/5    Left Shoulder External Rotation  3/5    Right/Left Hip  Right;Left    Right Hip Flexion  3-/5    Left Hip Flexion  3-/5    Right/Left Knee  Right;Left    Right Knee Flexion  3-/5    Right Knee Extension   3+/5    Left Knee Flexion  4-/5    Left Knee Extension  3+/5      Transfers   Five time sit to stand comments   18.25 seconds      Ambulation/Gait   Gait Pattern  Step-through pattern;Trendelenburg    Gait Comments  ambulates close to the wall with arm closes in low guard      Standardized Balance Assessment   Standardized Balance Assessment  Berg Balance Test      Berg Balance Test   Sit to Stand  Able to stand without using hands and stabilize independently    Standing Unsupported  Able to stand 2 minutes with supervision    Sitting with Back Unsupported but Feet Supported on Floor or Stool  Able to sit safely and securely 2 minutes    Stand to Sit  Sits safely with minimal use of hands    Transfers  Able to transfer safely, minor use of hands    Standing Unsupported with Eyes Closed  Able to stand 10 seconds with supervision    Standing Unsupported with Feet Together  Able to place feet together independently and stand for 1 minute with supervision    From Standing, Reach Forward with Outstretched Arm  Can reach confidently >25 cm (10")    From Standing Position, Pick up Object from Floor  Able to pick up shoe, needs supervision                Objective measurements completed on examination: See above findings.              PT Education - 10/22/19 1305    Education Details  Quad sets, hamstring sets, hip abduction isometrics, pillow squeeze, draw ins    Person(s) Educated  Patient    Methods  Explanation;Demonstration;Handout    Comprehension  Verbalized understanding          PT Long Term Goals - 10/22/19 1312      PT LONG TERM GOAL #1   Title  Patient will be independent with HEP    Time  8    Period  Weeks    Status  New      PT LONG TERM GOAL #2  Title  Patient will demonstrate 4/5 or greater LE and UE MMT to improve stability during functional tasks.    Time  8    Period  Weeks    Status  New      PT LONG TERM GOAL #3   Title   Patient will perform 5x sit to stand test in 15 seconds or less to improve functional LE strength and decrease risk of falls.    Time  8    Period  Weeks    Status  New      PT LONG TERM GOAL #4   Title  Patient will improve Berg Balance Scale by 5 points or greater to decrease risk of falls.    Time  8    Period  Weeks    Status  New             Plan - 10/22/19 1307    Clinical Impression Statement  Patient is a 29 year old right handed male who presents to physical therapy with bilateral LE and UE weakness, right extremities more affected than left, and decreased balance secondary to multiple sclerosis. Patient's 5x sit to stand test categorizes him as a fall risk with decreased functional LE strength. Patient ambulated with step through gait pattern, Trendelenburg gait, and noted with walking close to the wall with arm in low guard. Patient and PT discussed gait training and fitting for cane for safety particularly for days when he is greatly affected by fatigue and weakness. Patient and PT discussed HEP as well as plan of care to address deficits and goals. Patient reported agreement and understanding. Patient would benefit from skilled physical therapy to address deficits and address patient's goals.    Personal Factors and Comorbidities  Comorbidity 1    Comorbidities  Multiple sclerosis, R THA    Examination-Activity Limitations  Stand;Stairs;Locomotion Level;Transfers;Carry    Stability/Clinical Decision Making  Stable/Uncomplicated    Clinical Decision Making  Low    Rehab Potential  Good    PT Frequency  2x / week    PT Duration  8 weeks    PT Treatment/Interventions  ADLs/Self Care Home Management;Cryotherapy;Electrical Stimulation;Moist Heat;Gait training;Stair training;Functional mobility training;Therapeutic activities;Therapeutic exercise;Balance training;Neuromuscular re-education;Passive range of motion;Patient/family education    PT Next Visit Plan  complete Berg  Balance Scale, nustep or bike, LE strengthening, balance TEs in various positions, gait training and cane fitting, cautious of fatigue    PT Home Exercise Plan  see patient education section    Consulted and Agree with Plan of Care  Patient       Patient will benefit from skilled therapeutic intervention in order to improve the following deficits and impairments:  Abnormal gait, Difficulty walking, Decreased activity tolerance, Decreased balance, Decreased strength, Postural dysfunction, Pain  Visit Diagnosis: Muscle weakness (generalized) - Plan: PT plan of care cert/re-cert  Unsteadiness on feet - Plan: PT plan of care cert/re-cert  Repeated falls - Plan: PT plan of care cert/re-cert     Problem List Patient Active Problem List   Diagnosis Date Noted  . Other headache syndrome 02/24/2019  . Dysesthesia 01/05/2019  . Other fatigue 01/05/2019  . Anxiety 09/21/2018  . Shifting sleep-work schedule 09/21/2018  . Multiple sclerosis ([place]) 09/02/2018  . Pain in left hip 12/04/2017  . Chronic right-sided low back pain with right-sided sciatica 11/18/2017  . Degenerative disc disease, lumbar 11/18/2017  . Idiopathic scoliosis in adult patient 11/18/2017  . Depression, recurrent ([place]) 12/13/2016    Torrie Mayers  Keyondra Lagrand, PT, DPT 10/22/2019, 1:38 PM  [facility] [address] [place], [place], [address] Phone: [phone]   Fax:  [phone]  Name: Isaac Scott MRN: [id number] Date of Birth: 1990-09-07

## 2019-10-25 LAB — STRATIFY JCV AB (W/ INDEX) W/ RFLX
Index Value: 0.15
Stratify JCV (TM) Ab w/Reflex Inhibition: NEGATIVE

## 2019-10-26 ENCOUNTER — Ambulatory Visit: Payer: 59 | Admitting: Physical Therapy

## 2019-10-27 NOTE — Telephone Encounter (Signed)
JCV ab drawn on 10/18/19 negative, index: 0.15

## 2019-10-28 ENCOUNTER — Other Ambulatory Visit: Payer: Self-pay

## 2019-10-28 ENCOUNTER — Ambulatory Visit: Payer: 59 | Admitting: *Deleted

## 2019-10-28 DIAGNOSIS — R262 Difficulty in walking, not elsewhere classified: Secondary | ICD-10-CM

## 2019-10-28 DIAGNOSIS — R2681 Unsteadiness on feet: Secondary | ICD-10-CM

## 2019-10-28 DIAGNOSIS — M6281 Muscle weakness (generalized): Secondary | ICD-10-CM

## 2019-10-28 DIAGNOSIS — M25562 Pain in left knee: Secondary | ICD-10-CM

## 2019-10-28 DIAGNOSIS — R296 Repeated falls: Secondary | ICD-10-CM

## 2019-10-28 NOTE — Therapy (Signed)
[facility] [address] [place], [place], [address] Phone: [phone]   Fax:  [phone]  Physical Therapy Treatment  Patient Details  Name: Isaac Scott MRN: [id number] Date of Birth: August 01, 1991 Referring Provider (PT): Arville Care, MD   Encounter Date: 10/28/2019  PT End of Session - 10/28/19 1652    Visit Number  2    Number of Visits  16    Date for PT Re-Evaluation  12/24/19    Authorization Type  Progress note every 10th visit    PT Start Time  1600    PT Stop Time  1650    PT Time Calculation (min)  50 min       Past Medical History:  Diagnosis Date  . Anxiety   . Depression   . Generalized headaches   . Hemorrhoids   . Multiple sclerosis (HCC)     Past Surgical History:  Procedure Laterality Date  . TOTAL HIP ARTHROPLASTY     Left     There were no vitals filed for this visit.      [facility] PT Assessment - 10/28/19 0001      Berg Balance Test   Sit to Stand  Able to stand without using hands and stabilize independently    Standing Unsupported  Able to stand 2 minutes with supervision    Sitting with Back Unsupported but Feet Supported on Floor or Stool  Able to sit safely and securely 2 minutes    Stand to Sit  Sits safely with minimal use of hands    Transfers  Able to transfer safely, minor use of hands    Standing Unsupported with Eyes Closed  Able to stand 10 seconds with supervision    Standing Unsupported with Feet Together  Able to place feet together independently and stand for 1 minute with supervision    From Standing, Reach Forward with Outstretched Arm  Can reach confidently >25 cm (10")    From Standing Position, Pick up Object from Floor  Able to pick up shoe, needs supervision    From Standing Position, Turn to Look Behind Over each Shoulder  Looks behind from both sides and weight shifts well    Turn 360 Degrees  Able to turn 360 degrees safely one side only in 4 seconds or less     Standing Unsupported, Alternately Place Feet on Step/Stool  Able to stand independently and safely and complete 8 steps in 20 seconds    Standing Unsupported, One Foot in Front  Able to plae foot ahead of the other independently and hold 30 seconds    Standing on One Leg  Able to lift leg independently and hold 5-10 seconds    Total Score  49                   [facility] Adult PT Treatment/Exercise - 10/28/19 0001      Ambulation/Gait   Ambulation/Gait  Yes    Ambulation/Gait Assistance  7: Independent    Assistive device  Straight cane    Gait Pattern  Step-through pattern;Trendelenburg      Exercises   Exercises  Lumbar;Knee/Hip;Shoulder      Lumbar Exercises: Aerobic   Nustep  L1 x 11 mins UE/LE activity      Knee/Hip Exercises: Standing   Lateral Step Up  1 set;Both;10 reps    Forward Step Up  Both;1 set;10 reps    Rocker Board  3 minutes   PF/DF balance  Shoulder Exercises: ROM/Strengthening   UBE (Upper Arm Bike)  90 RPMS x 5 mins                  PT Long Term Goals - 10/22/19 1312      PT LONG TERM GOAL #1   Title  Patient will be independent with HEP    Time  8    Period  Weeks    Status  New      PT LONG TERM GOAL #2   Title  Patient will demonstrate 4/5 or greater LE and UE MMT to improve stability during functional tasks.    Time  8    Period  Weeks    Status  New      PT LONG TERM GOAL #3   Title  Patient will perform 5x sit to stand test in 15 seconds or less to improve functional LE strength and decrease risk of falls.    Time  8    Period  Weeks    Status  New      PT LONG TERM GOAL #4   Title  Patient will improve Berg Balance Scale by 5 points or greater to decrease risk of falls.    Time  8    Period  Weeks    Status  New            Plan - 10/28/19 1802    Clinical Impression Statement  Pt arrived today feeling ok. He was able to perform strengtheng exs as well as balance act's today with guidence and supervision  and did fairly well. Berg test was completed with a 49 total score. Pt was also instructed in gait with SPC and did well    Comorbidities  Multiple sclerosis, R THA    Examination-Activity Limitations  Stand;Stairs;Locomotion Level;Transfers;Carry    Stability/Clinical Decision Making  Stable/Uncomplicated    Rehab Potential  Good    PT Frequency  2x / week    PT Duration  8 weeks    PT Treatment/Interventions  ADLs/Self Care Home Management;Cryotherapy;Electrical Stimulation;Moist Heat;Gait training;Stair training;Functional mobility training;Therapeutic activities;Therapeutic exercise;Balance training;Neuromuscular re-education;Passive range of motion;Patient/family education    PT Next Visit Plan  complete Berg Balance Scale, nustep or bike, LE strengthening, balance TEs in various positions, gait training and cane fitting, cautious of fatigue    PT Home Exercise Plan  see patient education section    Consulted and Agree with Plan of Care  Patient       Patient will benefit from skilled therapeutic intervention in order to improve the following deficits and impairments:  Abnormal gait, Difficulty walking, Decreased activity tolerance, Decreased balance, Decreased strength, Postural dysfunction, Pain  Visit Diagnosis: Muscle weakness (generalized)  Unsteadiness on feet  Repeated falls  Acute pain of left knee  Difficulty in walking, not elsewhere classified     Problem List Patient Active Problem List   Diagnosis Date Noted  . Other headache syndrome 02/24/2019  . Dysesthesia 01/05/2019  . Other fatigue 01/05/2019  . Anxiety 09/21/2018  . Shifting sleep-work schedule 09/21/2018  . Multiple sclerosis ([place]) 09/02/2018  . Pain in left hip 12/04/2017  . Chronic right-sided low back pain with right-sided sciatica 11/18/2017  . Degenerative disc disease, lumbar 11/18/2017  . Idiopathic scoliosis in adult patient 11/18/2017  . Depression, recurrent ([place]) 12/13/2016     Melayna Robarts,[name] , PTA 10/28/2019, 6:06 PM  [facility] [address] [place], [place], [address] Phone: [phone]   Fax:  [phone]  Name: Isaac Scott MRN: [id number] Date of Birth: 1990/12/03

## 2019-10-29 ENCOUNTER — Encounter: Payer: 59 | Admitting: Physical Therapy

## 2019-11-09 ENCOUNTER — Ambulatory Visit: Payer: 59 | Attending: Family Medicine | Admitting: Physical Therapy

## 2019-11-09 ENCOUNTER — Other Ambulatory Visit: Payer: Self-pay

## 2019-11-09 DIAGNOSIS — R2681 Unsteadiness on feet: Secondary | ICD-10-CM

## 2019-11-09 DIAGNOSIS — R262 Difficulty in walking, not elsewhere classified: Secondary | ICD-10-CM | POA: Insufficient documentation

## 2019-11-09 DIAGNOSIS — R296 Repeated falls: Secondary | ICD-10-CM

## 2019-11-09 DIAGNOSIS — M6281 Muscle weakness (generalized): Secondary | ICD-10-CM | POA: Diagnosis present

## 2019-11-09 DIAGNOSIS — M25562 Pain in left knee: Secondary | ICD-10-CM | POA: Insufficient documentation

## 2019-11-09 NOTE — Therapy (Signed)
[facility] [address] [place], [place], [address] Phone: [phone]   Fax:  [phone]  Physical Therapy Treatment  Patient Details  Name: Isaac Scott MRN: [id number] Date of Birth: June 14, 1991 Referring Provider (PT): Arville Care, MD   Encounter Date: 11/09/2019  PT End of Session - 11/09/19 1815    Visit Number  3    Number of Visits  16    Date for PT Re-Evaluation  12/24/19    Authorization Type  Progress note every 10th visit    PT Start Time  1600    PT Stop Time  1648    PT Time Calculation (min)  48 min    Activity Tolerance  Patient tolerated treatment well    Behavior During Therapy  [facility] for tasks assessed/performed       Past Medical History:  Diagnosis Date  . Anxiety   . Depression   . Generalized headaches   . Hemorrhoids   . Multiple sclerosis (HCC)     Past Surgical History:  Procedure Laterality Date  . TOTAL HIP ARTHROPLASTY     Left     There were no vitals filed for this visit.  Subjective Assessment - 11/09/19 1607    Subjective  COVID-19 screening performed upon arrival. Reports doing well with no falls recently.    Pertinent History  Multiple sclerosis (diagnoses 2019), left THA    Limitations  Walking;House hold activities;Standing    Diagnostic tests  MRI    Patient Stated Goals  improve movement, improve balance, improve strength    Currently in Pain?  Yes   "minimal"        [facility] PT Assessment - 11/09/19 0001      Assessment   Medical Diagnosis  Multiple Sclerosis    Referring Provider (PT)  Arville Care, MD    Hand Dominance  Right    Next MD Visit  n/a    Prior Therapy  no      Precautions   Precautions  Fall                   [facility] Adult PT Treatment/Exercise - 11/09/19 0001      Lumbar Exercises: Aerobic   Nustep  L3 x 11 mins UE/LE activity      Knee/Hip Exercises: Standing   Rocker Board  3 minutes   calf stretching     Knee/Hip  Exercises: Supine   Bridges with Ball Squeeze  AROM;2 sets;10 reps    Straight Leg Raises  AROM;Both;10 reps      Shoulder Exercises: ROM/Strengthening   UBE (Upper Arm Bike)  90 RPMS x 6 mins 3' fwd, 3' bwd          Balance Exercises - 11/09/19 1820      Balance Exercises: Standing   Rockerboard  Anterior/posterior;Lateral;Other time (comment);Intermittent UE support   2x1 min AP; 2x1 min lateral 4 mins total   Balance Beam  tandem walk x3 mins, lateral stepping x3 mins             PT Long Term Goals - 10/22/19 1312      PT LONG TERM GOAL #1   Title  Patient will be independent with HEP    Time  8    Period  Weeks    Status  New      PT LONG TERM GOAL #2   Title  Patient will demonstrate 4/5 or greater LE and UE MMT to improve stability  during functional tasks.    Time  8    Period  Weeks    Status  New      PT LONG TERM GOAL #3   Title  Patient will perform 5x sit to stand test in 15 seconds or less to improve functional LE strength and decrease risk of falls.    Time  8    Period  Weeks    Status  New      PT LONG TERM GOAL #4   Title  Patient will improve Berg Balance Scale by 5 points or greater to decrease risk of falls.    Time  8    Period  Weeks    Status  New            Plan - 11/09/19 1816    Clinical Impression Statement  Patient responded fairly well to therapy session despite increasing fatigue levels as therapy session continued. Patient was able to perform balance activities with close supervision. Patient noted with bilateral quadriceps weakness and muscle fatigue as noted by muscle fasciculations and extension lag with SLR. Patient rated fatigue level at 5/10 at end of session.    Personal Factors and Comorbidities  Comorbidity 1    Comorbidities  Multiple sclerosis, R THA    Examination-Activity Limitations  Stand;Stairs;Locomotion Level;Transfers;Carry    Stability/Clinical Decision Making  Stable/Uncomplicated    Clinical Decision  Making  Low    Rehab Potential  Good    PT Frequency  2x / week    PT Duration  8 weeks    PT Treatment/Interventions  ADLs/Self Care Home Management;Cryotherapy;Electrical Stimulation;Moist Heat;Gait training;Stair training;Functional mobility training;Therapeutic activities;Therapeutic exercise;Balance training;Neuromuscular re-education;Passive range of motion;Patient/family education    PT Next Visit Plan  nustep or bike, LE strengthening, balance TEs in various positions, gait training and cane fitting, cautious of fatigue    PT Home Exercise Plan  see patient education section    Consulted and Agree with Plan of Care  Patient       Patient will benefit from skilled therapeutic intervention in order to improve the following deficits and impairments:  Abnormal gait, Difficulty walking, Decreased activity tolerance, Decreased balance, Decreased strength, Postural dysfunction, Pain  Visit Diagnosis: Muscle weakness (generalized)  Repeated falls  Unsteadiness on feet     Problem List Patient Active Problem List   Diagnosis Date Noted  . Other headache syndrome 02/24/2019  . Dysesthesia 01/05/2019  . Other fatigue 01/05/2019  . Anxiety 09/21/2018  . Shifting sleep-work schedule 09/21/2018  . Multiple sclerosis ([place]) 09/02/2018  . Pain in left hip 12/04/2017  . Chronic right-sided low back pain with right-sided sciatica 11/18/2017  . Degenerative disc disease, lumbar 11/18/2017  . Idiopathic scoliosis in adult patient 11/18/2017  . Depression, recurrent ([place]) 12/13/2016    Gabriela Eves, PT, DPT 11/09/2019, 6:23 PM  [facility] [address] [place], [place], [address] Phone: [phone]   Fax:  [phone]  Name: Isaac Scott MRN: [id number] Date of Birth: Feb 06, 1991

## 2019-11-11 ENCOUNTER — Other Ambulatory Visit: Payer: Self-pay

## 2019-11-11 ENCOUNTER — Ambulatory Visit: Payer: 59 | Admitting: *Deleted

## 2019-11-11 DIAGNOSIS — R296 Repeated falls: Secondary | ICD-10-CM

## 2019-11-11 DIAGNOSIS — R2681 Unsteadiness on feet: Secondary | ICD-10-CM

## 2019-11-11 DIAGNOSIS — R262 Difficulty in walking, not elsewhere classified: Secondary | ICD-10-CM

## 2019-11-11 DIAGNOSIS — M25562 Pain in left knee: Secondary | ICD-10-CM

## 2019-11-11 DIAGNOSIS — M6281 Muscle weakness (generalized): Secondary | ICD-10-CM

## 2019-11-11 NOTE — Therapy (Signed)
[facility] [address] [place], [place], [address] Phone: [phone]   Fax:  [phone]  Physical Therapy Treatment  Patient Details  Name: Isaac Scott MRN: [id number] Date of Birth: [birth year] Referring Provider (PT): Arville Care, MD   Encounter Date: 11/11/2019  PT End of Session - 11/11/19 1612    Visit Number  4    Number of Visits  16    Date for PT Re-Evaluation  12/24/19    Authorization Type  Progress note every 10th visit    PT Start Time  1605    PT Stop Time  1654    PT Time Calculation (min)  49 min       Past Medical History:  Diagnosis Date  . Anxiety   . Depression   . Generalized headaches   . Hemorrhoids   . Multiple sclerosis (HCC)     Past Surgical History:  Procedure Laterality Date  . TOTAL HIP ARTHROPLASTY     Left     There were no vitals filed for this visit.  Subjective Assessment - 11/11/19 1610    Subjective  COVID-19 screening performed upon arrival. Reports doing well with no falls recently.Sore after last Rx    Pertinent History  Multiple sclerosis (diagnoses 2019), left THA    Limitations  Walking;House hold activities;Standing    Diagnostic tests  MRI    Patient Stated Goals  improve movement, improve balance, improve strength    Currently in Pain?  Yes    Pain Score  4     Pain Location  Leg    Pain Orientation  Right;Left    Pain Descriptors / Indicators  Numbness                       [facility] Adult PT Treatment/Exercise - 11/11/19 0001      Exercises   Exercises  Lumbar;Knee/Hip;Shoulder      Lumbar Exercises: Aerobic   Nustep  L3 x 10 mins UE/LE activity      Knee/Hip Exercises: Standing   Rocker Board  4 minutes   calf stretching   Other Standing Knee Exercises  standing on inverted BOSU NBOS x 4 mins      Knee/Hip Exercises: Supine   Bridges with Ball Squeeze  AROM;10 reps;3 sets    Straight Leg Raises  Both;10 reps;1 set   2x10 on RT     Shoulder Exercises: ROM/Strengthening   UBE (Upper Arm Bike)  90 RPMS x 6 mins 3' fwd, 3' bwd                  PT Long Term Goals - 10/22/19 1312      PT LONG TERM GOAL #1   Title  Patient will be independent with HEP    Time  8    Period  Weeks    Status  New      PT LONG TERM GOAL #2   Title  Patient will demonstrate 4/5 or greater LE and UE MMT to improve stability during functional tasks.    Time  8    Period  Weeks    Status  New      PT LONG TERM GOAL #3   Title  Patient will perform 5x sit to stand test in 15 seconds or less to improve functional LE strength and decrease risk of falls.    Time  8    Period  Weeks    Status  New      PT LONG TERM GOAL #4   Title  Patient will improve Berg Balance Scale by 5 points or greater to decrease risk of falls.    Time  8    Period  Weeks    Status  New            Plan - 11/11/19 1710    Clinical Impression Statement  Pt arrived today doing fairly well and reports being tired after last Rx. He was able to perform all exs today with some progression, but only couldn't perform  2 sets SLR on LT LE due to muscle fatigue/ pain    Personal Factors and Comorbidities  Comorbidity 1    Comorbidities  Multiple sclerosis, R THA    Examination-Activity Limitations  Stand;Stairs;Locomotion Level;Transfers;Carry    Stability/Clinical Decision Making  Stable/Uncomplicated    Rehab Potential  Good    PT Frequency  2x / week    PT Duration  8 weeks    PT Treatment/Interventions  ADLs/Self Care Home Management;Cryotherapy;Electrical Stimulation;Moist Heat;Gait training;Stair training;Functional mobility training;Therapeutic activities;Therapeutic exercise;Balance training;Neuromuscular re-education;Passive range of motion;Patient/family education    PT Next Visit Plan  nustep or bike, LE strengthening, balance TEs in various positions, gait training and cane fitting, cautious of fatigue    PT Home Exercise Plan  see patient  education section       Patient will benefit from skilled therapeutic intervention in order to improve the following deficits and impairments:  Abnormal gait, Difficulty walking, Decreased activity tolerance, Decreased balance, Decreased strength, Postural dysfunction, Pain  Visit Diagnosis: Muscle weakness (generalized)  Repeated falls  Unsteadiness on feet  Acute pain of left knee  Difficulty in walking, not elsewhere classified     Problem List Patient Active Problem List   Diagnosis Date Noted  . Other headache syndrome 02/24/2019  . Dysesthesia 01/05/2019  . Other fatigue 01/05/2019  . Anxiety 09/21/2018  . Shifting sleep-work schedule 09/21/2018  . Multiple sclerosis ([place]) 09/02/2018  . Pain in left hip 12/04/2017  . Chronic right-sided low back pain with right-sided sciatica 11/18/2017  . Degenerative disc disease, lumbar 11/18/2017  . Idiopathic scoliosis in adult patient 11/18/2017  . Depression, recurrent ([place]) 12/13/2016    Tequila Rottmann,[name], PTA 11/11/2019, 5:18 PM  [facility] [address] [place], [place], [address] Phone: [phone]   Fax:  [phone]  Name: Isaac Scott MRN: [id number] Date of Birth: September 09, 1990

## 2019-11-16 ENCOUNTER — Encounter: Payer: 59 | Admitting: *Deleted

## 2019-11-17 ENCOUNTER — Ambulatory Visit: Payer: 59 | Admitting: Neurology

## 2019-11-18 ENCOUNTER — Encounter: Payer: 59 | Admitting: *Deleted

## 2019-11-18 ENCOUNTER — Encounter: Payer: Self-pay | Admitting: Neurology

## 2019-11-23 ENCOUNTER — Ambulatory Visit: Payer: 59 | Admitting: *Deleted

## 2019-11-25 ENCOUNTER — Encounter: Payer: 59 | Admitting: *Deleted

## 2019-11-30 ENCOUNTER — Ambulatory Visit: Payer: 59 | Admitting: *Deleted

## 2019-11-30 ENCOUNTER — Encounter: Payer: Self-pay | Admitting: *Deleted

## 2019-11-30 ENCOUNTER — Other Ambulatory Visit: Payer: Self-pay

## 2019-11-30 DIAGNOSIS — M6281 Muscle weakness (generalized): Secondary | ICD-10-CM | POA: Diagnosis not present

## 2019-11-30 DIAGNOSIS — M25562 Pain in left knee: Secondary | ICD-10-CM

## 2019-11-30 DIAGNOSIS — R2681 Unsteadiness on feet: Secondary | ICD-10-CM

## 2019-11-30 DIAGNOSIS — R262 Difficulty in walking, not elsewhere classified: Secondary | ICD-10-CM

## 2019-11-30 DIAGNOSIS — R296 Repeated falls: Secondary | ICD-10-CM

## 2019-11-30 NOTE — Therapy (Signed)
[facility] [address] [place], [place], [address] Phone: [phone]   Fax:  [phone]  Physical Therapy Treatment  Patient Details  Name: Isaac Scott MRN: [id number] Date of Birth: 1990-09-06 Referring Provider (PT): Arville Care, MD   Encounter Date: 11/30/2019  PT End of Session - 11/30/19 1700    Visit Number  5    Number of Visits  16    Date for PT Re-Evaluation  12/24/19    Authorization Type  Progress note every 10th visit    PT Start Time  1655    PT Stop Time  1735    PT Time Calculation (min)  40 min       Past Medical History:  Diagnosis Date  . Anxiety   . Depression   . Generalized headaches   . Hemorrhoids   . Multiple sclerosis (HCC)     Past Surgical History:  Procedure Laterality Date  . TOTAL HIP ARTHROPLASTY     Left     There were no vitals filed for this visit.  Subjective Assessment - 11/30/19 1658    Subjective  COVID-19 screening performed upon arrival. Reports doing well with no falls recently. Energy is low today    Pertinent History  Multiple sclerosis (diagnoses 2019), left THA    Limitations  Walking;House hold activities;Standing    Diagnostic tests  MRI    Patient Stated Goals  improve movement, improve balance, improve strength                       [facility] Adult PT Treatment/Exercise - 11/30/19 0001      Exercises   Exercises  Lumbar;Knee/Hip;Shoulder      Lumbar Exercises: Aerobic   Nustep  L4 x 10 mins UE/LE activity      Knee/Hip Exercises: Standing   Rocker Board  4 minutes   calf stretching   Other Standing Knee Exercises  standing on inverted BOSU NBOS x 4 mins      Knee/Hip Exercises: Supine   Bridges with Ball Squeeze  AROM;10 reps;3 sets    Straight Leg Raises  Both;10 reps;2 sets   2x10 on RT     Shoulder Exercises: Standing   Extension  Strengthening;20 reps;10 reps   XTS blue      Shoulder Exercises: ROM/Strengthening   UBE (Upper  Arm Bike)  60 RPMS x 6 mins 3' fwd, 3' bwd                  PT Long Term Goals - 10/22/19 1312      PT LONG TERM GOAL #1   Title  Patient will be independent with HEP    Time  8    Period  Weeks    Status  New      PT LONG TERM GOAL #2   Title  Patient will demonstrate 4/5 or greater LE and UE MMT to improve stability during functional tasks.    Time  8    Period  Weeks    Status  New      PT LONG TERM GOAL #3   Title  Patient will perform 5x sit to stand test in 15 seconds or less to improve functional LE strength and decrease risk of falls.    Time  8    Period  Weeks    Status  New      PT LONG TERM GOAL #4   Title  Patient will  improve Berg Balance Scale by 5 points or greater to decrease risk of falls.    Time  8    Period  Weeks    Status  New            Plan - 11/30/19 1753    Clinical Impression Statement  Pt arrived with low energy levels , but was able to perform balance and other therex fairly well. CC was mm fatigue in hips and calves    Personal Factors and Comorbidities  Comorbidity 1    Comorbidities  Multiple sclerosis, R THA    Examination-Activity Limitations  Stand;Stairs;Locomotion Level;Transfers;Carry    Stability/Clinical Decision Making  Stable/Uncomplicated    Rehab Potential  Good    PT Frequency  2x / week    PT Duration  8 weeks    PT Treatment/Interventions  ADLs/Self Care Home Management;Cryotherapy;Electrical Stimulation;Moist Heat;Gait training;Stair training;Functional mobility training;Therapeutic activities;Therapeutic exercise;Balance training;Neuromuscular re-education;Passive range of motion;Patient/family education    PT Home Exercise Plan  see patient education section       Patient will benefit from skilled therapeutic intervention in order to improve the following deficits and impairments:  Abnormal gait, Difficulty walking, Decreased activity tolerance, Decreased balance, Decreased strength, Postural dysfunction,  Pain  Visit Diagnosis: Repeated falls  Unsteadiness on feet  Acute pain of left knee  Difficulty in walking, not elsewhere classified     Problem List Patient Active Problem List   Diagnosis Date Noted  . Other headache syndrome 02/24/2019  . Dysesthesia 01/05/2019  . Other fatigue 01/05/2019  . Anxiety 09/21/2018  . Shifting sleep-work schedule 09/21/2018  . Multiple sclerosis ([place]) 09/02/2018  . Pain in left hip 12/04/2017  . Chronic right-sided low back pain with right-sided sciatica 11/18/2017  . Degenerative disc disease, lumbar 11/18/2017  . Idiopathic scoliosis in adult patient 11/18/2017  . Depression, recurrent ([place]) 12/13/2016    [name],CHRISPTA 11/30/2019, 5:55 PM  [facility] [place], [place], [address] Phone: [phone]   Fax:  [phone]  Name: Isaac Scott MRN: [id number] Date of Birth: 02-Sep-1990

## 2019-12-02 ENCOUNTER — Other Ambulatory Visit: Payer: Self-pay

## 2019-12-02 ENCOUNTER — Ambulatory Visit: Payer: 59 | Admitting: *Deleted

## 2019-12-02 DIAGNOSIS — M25562 Pain in left knee: Secondary | ICD-10-CM

## 2019-12-02 DIAGNOSIS — M6281 Muscle weakness (generalized): Secondary | ICD-10-CM

## 2019-12-02 DIAGNOSIS — R2681 Unsteadiness on feet: Secondary | ICD-10-CM

## 2019-12-02 DIAGNOSIS — R296 Repeated falls: Secondary | ICD-10-CM

## 2019-12-02 DIAGNOSIS — R262 Difficulty in walking, not elsewhere classified: Secondary | ICD-10-CM

## 2019-12-02 NOTE — Therapy (Signed)
[facility] [address] [place], [place], [address] Phone: [phone]   Fax:  [phone]  Physical Therapy Treatment  Patient Details  Name: Isaac Scott MRN: [id number] Date of Birth: 1990-10-08 Referring Provider (PT): Arville Care, MD   Encounter Date: 12/02/2019  PT End of Session - 12/02/19 1750    Visit Number  6    Number of Visits  16    Date for PT Re-Evaluation  12/24/19    Authorization Type  Progress note every 10th visit    PT Start Time  1645    PT Stop Time  1734    PT Time Calculation (min)  49 min       Past Medical History:  Diagnosis Date  . Anxiety   . Depression   . Generalized headaches   . Hemorrhoids   . Multiple sclerosis (HCC)     Past Surgical History:  Procedure Laterality Date  . TOTAL HIP ARTHROPLASTY     Left     There were no vitals filed for this visit.  Subjective Assessment - 12/02/19 1656    Subjective  COVID-19 screening performed upon arrival. Reports doing well with no falls recently. LT leg/hip sore after last Rx    Pertinent History  Multiple sclerosis (diagnoses 2019), left THA    Limitations  Walking;House hold activities;Standing    Diagnostic tests  MRI    Patient Stated Goals  improve movement, improve balance, improve strength    Currently in Pain?  Yes    Pain Score  6     Pain Location  Leg    Pain Orientation  Left    Pain Descriptors / Indicators  Numbness;Sore;Throbbing                       [facility] Adult PT Treatment/Exercise - 12/02/19 0001      Exercises   Exercises  Lumbar;Knee/Hip;Shoulder      Lumbar Exercises: Aerobic   Nustep  L4 x 10 mins UE/LE activity   try L5 next RX     Knee/Hip Exercises: Standing   Rocker Board  4 minutes   calf stretching   Other Standing Knee Exercises  standing on inverted BOSU NBOS x 4 mins      Knee/Hip Exercises: Supine   Bridges with Ball Squeeze  AROM;3 sets;20 reps      Knee/Hip  Exercises: Sidelying   Hip ABduction  AROM;Both;2 sets;10 reps      Shoulder Exercises: Standing   Extension  Strengthening;20 reps;10 reps   XTS blue      Shoulder Exercises: ROM/Strengthening   UBE (Upper Arm Bike)  60 RPMS x 6 mins 3' fwd, 3' bwd          Balance Exercises - 12/02/19 1747      Balance Exercises: Standing   Rockerboard  Anterior/posterior;Lateral;Other time (comment);Intermittent UE support             PT Long Term Goals - 10/22/19 1312      PT LONG TERM GOAL #1   Title  Patient will be independent with HEP    Time  8    Period  Weeks    Status  New      PT LONG TERM GOAL #2   Title  Patient will demonstrate 4/5 or greater LE and UE MMT to improve stability during functional tasks.    Time  8    Period  Weeks  Status  New      PT LONG TERM GOAL #3   Title  Patient will perform 5x sit to stand test in 15 seconds or less to improve functional LE strength and decrease risk of falls.    Time  8    Period  Weeks    Status  New      PT LONG TERM GOAL #4   Title  Patient will improve Berg Balance Scale by 5 points or greater to decrease risk of falls.    Time  8    Period  Weeks    Status  New            Plan - 12/02/19 1754    Clinical Impression Statement  Pt arrived today doing fairly well and reports dpoing ok after last Rx. He was guided through balance act.'s as well as LE strengthening exs. He still experienced some pain in his LT hip , but today with hip abduction. Just soreness end of session.    Personal Factors and Comorbidities  Comorbidity 1    Comorbidities  Multiple sclerosis, R THA    Examination-Activity Limitations  Stand;Stairs;Locomotion Level;Transfers;Carry    Rehab Potential  Good    PT Frequency  2x / week    PT Duration  8 weeks    PT Treatment/Interventions  ADLs/Self Care Home Management;Cryotherapy;Electrical Stimulation;Moist Heat;Gait training;Stair training;Functional mobility training;Therapeutic  activities;Therapeutic exercise;Balance training;Neuromuscular re-education;Passive range of motion;Patient/family education    PT Next Visit Plan  nustep or bike, LE strengthening, balance TEs in various positions, gait training and cane fitting, cautious of fatigue       Patient will benefit from skilled therapeutic intervention in order to improve the following deficits and impairments:  Abnormal gait, Difficulty walking, Decreased activity tolerance, Decreased balance, Decreased strength, Postural dysfunction, Pain  Visit Diagnosis: Repeated falls  Unsteadiness on feet  Acute pain of left knee  Difficulty in walking, not elsewhere classified  Muscle weakness (generalized)     Problem List Patient Active Problem List   Diagnosis Date Noted  . Other headache syndrome 02/24/2019  . Dysesthesia 01/05/2019  . Other fatigue 01/05/2019  . Anxiety 09/21/2018  . Shifting sleep-work schedule 09/21/2018  . Multiple sclerosis ([place]) 09/02/2018  . Pain in left hip 12/04/2017  . Chronic right-sided low back pain with right-sided sciatica 11/18/2017  . Degenerative disc disease, lumbar 11/18/2017  . Idiopathic scoliosis in adult patient 11/18/2017  . Depression, recurrent ([place]) 12/13/2016    Izabellah Dadisman,[name], PTA 12/02/2019, 6:00 PM  [facility] [place], [place], [address] Phone: [phone]   Fax:  [phone]  Name: Isaac Scott MRN: [id number] Date of Birth: 12-Jan-1991

## 2019-12-07 ENCOUNTER — Other Ambulatory Visit: Payer: Self-pay

## 2019-12-07 ENCOUNTER — Ambulatory Visit: Payer: 59 | Attending: Family Medicine | Admitting: Physical Therapy

## 2019-12-07 ENCOUNTER — Encounter: Payer: Self-pay | Admitting: Physical Therapy

## 2019-12-07 DIAGNOSIS — R2681 Unsteadiness on feet: Secondary | ICD-10-CM | POA: Insufficient documentation

## 2019-12-07 DIAGNOSIS — M6281 Muscle weakness (generalized): Secondary | ICD-10-CM | POA: Diagnosis present

## 2019-12-07 DIAGNOSIS — R296 Repeated falls: Secondary | ICD-10-CM | POA: Diagnosis not present

## 2019-12-07 DIAGNOSIS — M25562 Pain in left knee: Secondary | ICD-10-CM | POA: Diagnosis present

## 2019-12-07 DIAGNOSIS — R262 Difficulty in walking, not elsewhere classified: Secondary | ICD-10-CM | POA: Insufficient documentation

## 2019-12-07 NOTE — Therapy (Signed)
[facility] [place], [place], [address] Phone: [phone]   Fax:  [phone]  Physical Therapy Treatment  Patient Details  Name: Isaac Scott MRN: [id number] Date of Birth: 1991-08-02 Referring Provider (PT): Caryl Pina, MD   Encounter Date: 12/07/2019  PT End of Session - 12/07/19 1652    Visit Number  7    Number of Visits  16    Date for PT Re-Evaluation  12/24/19    Authorization Type  Progress note every 10th visit    PT Start Time  1645    PT Stop Time  1733    PT Time Calculation (min)  48 min    Activity Tolerance  Patient tolerated treatment well    Behavior During Therapy  [facility] for tasks assessed/performed       Past Medical History:  Diagnosis Date  . Anxiety   . Depression   . Generalized headaches   . Hemorrhoids   . Multiple sclerosis ([place])     Past Surgical History:  Procedure Laterality Date  . TOTAL HIP ARTHROPLASTY     Left     There were no vitals filed for this visit.  Subjective Assessment - 12/07/19 1650    Subjective  COVID-19 screening performed upon arrival. Patient reports right hip thigh area was a little sore after last session but no falls since last session.    Pertinent History  Multiple sclerosis (diagnoses 2019), left THA    Limitations  Walking;House hold activities;Standing    Diagnostic tests  MRI    Patient Stated Goals  improve movement, improve balance, improve strength    Currently in Pain?  Yes    Pain Score  7     Pain Location  Hip    Pain Orientation  Right    Pain Descriptors / Indicators  Sore    Pain Type  Chronic pain    Pain Onset  More than a month ago    Pain Frequency  Intermittent         [facility] PT Assessment - 12/07/19 0001      Assessment   Medical Diagnosis  Multiple Sclerosis    Referring Provider (PT)  Caryl Pina, MD    Hand Dominance  Right    Next MD Visit  n/a    Prior Therapy  no      Precautions   Precautions  [facility] Adult PT Treatment/Exercise - 12/07/19 0001      Exercises   Exercises  Lumbar;Knee/Hip;Shoulder      Lumbar Exercises: Aerobic   Nustep  L5 x 10 mins UE/LE activity      Knee/Hip Exercises: Standing   SLS with Vectors  tapping 3 colored pods x1 min each LE    Other Standing Knee Exercises  standing on inverted BOSU NBOS x 4 mins, 1 min static balance, 1 min mini squats, 1 min cognitive challenge, 1 min perturbations with intermittent UE support    Other Standing Knee Exercises  standing step outs with 2# medicine ball x10, followed by 4# medicine ball step outs x10      Knee/Hip Exercises: Seated   Sit to Sand  3 sets;10 reps;without UE support   5# weight at chest, x10 with 2" step under foot bilaterally     Shoulder Exercises: ROM/Strengthening   UBE (Upper Arm Bike)  60 RPMS x  8 mins 4' fwd, 4' bwd                  PT Long Term Goals - 10/22/19 1312      PT LONG TERM GOAL #1   Title  Patient will be independent with HEP    Time  8    Period  Weeks    Status  New      PT LONG TERM GOAL #2   Title  Patient will demonstrate 4/5 or greater LE and UE MMT to improve stability during functional tasks.    Time  8    Period  Weeks    Status  New      PT LONG TERM GOAL #3   Title  Patient will perform 5x sit to stand test in 15 seconds or less to improve functional LE strength and decrease risk of falls.    Time  8    Period  Weeks    Status  New      PT LONG TERM GOAL #4   Title  Patient will improve Berg Balance Scale by 5 points or greater to decrease risk of falls.    Time  8    Period  Weeks    Status  New            Plan - 12/07/19 1705    Clinical Impression Statement  Patient responded well to therapy session with only reports of fatigue and soreness. Patient guided through new TEs for functional strength and balance to which patient required stand by assist to prevent loss of balance. Assess goals next visit     Personal Factors and Comorbidities  Comorbidity 1    Comorbidities  Multiple sclerosis, R THA    Examination-Activity Limitations  Stand;Stairs;Locomotion Level;Transfers;Carry    Stability/Clinical Decision Making  Stable/Uncomplicated    Clinical Decision Making  Low    Rehab Potential  Good    PT Frequency  2x / week    PT Duration  8 weeks    PT Treatment/Interventions  ADLs/Self Care Home Management;Cryotherapy;Electrical Stimulation;Moist Heat;Gait training;Stair training;Functional mobility training;Therapeutic activities;Therapeutic exercise;Balance training;Neuromuscular re-education;Passive range of motion;Patient/family education    PT Next Visit Plan  Assess goals next visit; nustep or bike, LE strengthening, balance TEs in various positions, cautious of fatigue and overheating secondary to MS    PT Home Exercise Plan  see patient education section    Consulted and Agree with Plan of Care  Patient       Patient will benefit from skilled therapeutic intervention in order to improve the following deficits and impairments:  Abnormal gait, Difficulty walking, Decreased activity tolerance, Decreased balance, Decreased strength, Postural dysfunction, Pain  Visit Diagnosis: Repeated falls  Unsteadiness on feet     Problem List Patient Active Problem List   Diagnosis Date Noted  . Other headache syndrome 02/24/2019  . Dysesthesia 01/05/2019  . Other fatigue 01/05/2019  . Anxiety 09/21/2018  . Shifting sleep-work schedule 09/21/2018  . Multiple sclerosis (HCC) 09/02/2018  . Pain in left hip 12/04/2017  . Chronic right-sided low back pain with right-sided sciatica 11/18/2017  . Degenerative disc disease, lumbar 11/18/2017  . Idiopathic scoliosis in adult patient 11/18/2017  . Depression, recurrent (HCC) 12/13/2016    Guss Bunde, PT, DPT 12/07/2019, 5:48 PM  [facility] [address] [place], [place], [address] Phone:  [phone]   Fax:  [phone]  Name: Isaac Scott MRN: [id number] Date of Birth: 1991/03/05

## 2019-12-09 ENCOUNTER — Other Ambulatory Visit: Payer: Self-pay

## 2019-12-09 ENCOUNTER — Ambulatory Visit: Payer: 59 | Admitting: *Deleted

## 2019-12-09 DIAGNOSIS — R2681 Unsteadiness on feet: Secondary | ICD-10-CM

## 2019-12-09 DIAGNOSIS — M25562 Pain in left knee: Secondary | ICD-10-CM

## 2019-12-09 DIAGNOSIS — M6281 Muscle weakness (generalized): Secondary | ICD-10-CM

## 2019-12-09 DIAGNOSIS — R296 Repeated falls: Secondary | ICD-10-CM

## 2019-12-09 DIAGNOSIS — R262 Difficulty in walking, not elsewhere classified: Secondary | ICD-10-CM

## 2019-12-09 NOTE — Therapy (Signed)
[facility] [address] [place], [place], [address] Phone: [phone]   Fax:  [phone]  Physical Therapy Treatment  Patient Details  Name: Isaac Scott MRN: [id number] Date of Birth: Sep 04, 1990 Referring Provider (PT): Arville Care, MD   Encounter Date: 12/09/2019  PT End of Session - 12/09/19 1703    Visit Number  8    Number of Visits  16    Date for PT Re-Evaluation  12/24/19    Authorization Type  Progress note every 10th visit    PT Start Time  1648    PT Stop Time  1733    PT Time Calculation (min)  45 min       Past Medical History:  Diagnosis Date  . Anxiety   . Depression   . Generalized headaches   . Hemorrhoids   . Multiple sclerosis (HCC)     Past Surgical History:  Procedure Laterality Date  . TOTAL HIP ARTHROPLASTY     Left     There were no vitals filed for this visit.  Subjective Assessment - 12/09/19 1700    Subjective  COVID-19 screening performed upon arrival. Patient reports sore after last Rx    Pertinent History  Multiple sclerosis (diagnoses 2019), left THA    Limitations  Walking;House hold activities;Standing    Diagnostic tests  MRI    Patient Stated Goals  improve movement, improve balance, improve strength    Currently in Pain?  No/denies         [facility] PT Assessment - 12/09/19 0001      Berg Balance Test   Sit to Stand  Able to stand without using hands and stabilize independently    Standing Unsupported  Able to stand safely 2 minutes    Sitting with Back Unsupported but Feet Supported on Floor or Stool  Able to sit safely and securely 2 minutes    Stand to Sit  Sits safely with minimal use of hands    Transfers  Able to transfer safely, minor use of hands    Standing Unsupported with Eyes Closed  Able to stand 10 seconds with supervision    Standing Unsupported with Feet Together  Able to place feet together independently and stand 1 minute safely    From Standing, Reach  Forward with Outstretched Arm  Can reach confidently >25 cm (10")    From Standing Position, Pick up Object from Floor  Able to pick up shoe safely and easily    From Standing Position, Turn to Look Behind Over each Shoulder  Looks behind from both sides and weight shifts well    Turn 360 Degrees  Able to turn 360 degrees safely in 4 seconds or less    Standing Unsupported, Alternately Place Feet on Step/Stool  Able to stand independently and safely and complete 8 steps in 20 seconds    Standing Unsupported, One Foot in Front  Able to place foot tandem independently and hold 30 seconds    Standing on One Leg  Able to lift leg independently and hold 5-10 seconds    Total Score  54                   [facility] Adult PT Treatment/Exercise - 12/09/19 0001      Exercises   Exercises  Lumbar;Knee/Hip;Shoulder      Lumbar Exercises: Aerobic   Nustep  L5 x 11 mins UE/LE activity      Knee/Hip Exercises: Standing  Forward Step Up  Both;1 set;10 reps;Step Height: 6"    Other Standing Knee Exercises  standing on inverted BOSU NBOS  2x10 mini-quats,  1 min perturbations with intermittent UE support    Other Standing Knee Exercises  standing step outs with 4# medicine ball 2 x10, 4# ball lifts with SLS x 10 each      Knee/Hip Exercises: Seated   Sit to Sand  2 sets;10 reps;without UE support   x5 in 15 secs     Shoulder Exercises: ROM/Strengthening   UBE (Upper Arm Bike)  60 RPMS x 8 mins 4' fwd, 4' bwd                  PT Long Term Goals - 12/09/19 1705      PT LONG TERM GOAL #1   Title  Patient will be independent with HEP    Time  8    Period  Weeks    Status  On-going      PT LONG TERM GOAL #2   Title  Patient will demonstrate 4/5 or greater LE and UE MMT to improve stability during functional tasks.    Time  8    Period  Weeks    Status  On-going      PT LONG TERM GOAL #3   Title  Patient will perform 5x sit to stand test in 15 seconds or less to improve  functional LE strength and decrease risk of falls.    Time  8    Period  Weeks    Status  Achieved      PT LONG TERM GOAL #4   Title  Patient will improve Berg Balance Scale by 5 points or greater to decrease risk of falls.    Time  8    Period  Weeks    Status  On-going            Plan - 12/09/19 1737    Clinical Impression Statement  Pt arrived today reporting being sore after last Rx, but doing ok now. Rx consisted of balance act.'s and LE strengthening. He did well on the Berg test with 54 points nd was able to meet LTG for sit to stand x 5 in 15 secs.    Comorbidities  Multiple sclerosis, R THA    Examination-Activity Limitations  Stand;Stairs;Locomotion Level;Transfers;Carry    Stability/Clinical Decision Making  Stable/Uncomplicated    Rehab Potential  Good    PT Frequency  2x / week    PT Duration  8 weeks    PT Treatment/Interventions  ADLs/Self Care Home Management;Cryotherapy;Electrical Stimulation;Moist Heat;Gait training;Stair training;Functional mobility training;Therapeutic activities;Therapeutic exercise;Balance training;Neuromuscular re-education;Passive range of motion;Patient/family education    PT Next Visit Plan  nustep or bike, LE strengthening, balance TEs in various positions, cautious of fatigue and overheating secondary to MS    PT Home Exercise Plan  see patient education section       Patient will benefit from skilled therapeutic intervention in order to improve the following deficits and impairments:  Abnormal gait, Difficulty walking, Decreased activity tolerance, Decreased balance, Decreased strength, Postural dysfunction, Pain  Visit Diagnosis: Repeated falls  Unsteadiness on feet  Acute pain of left knee  Difficulty in walking, not elsewhere classified  Muscle weakness (generalized)     Problem List Patient Active Problem List   Diagnosis Date Noted  . Other headache syndrome 02/24/2019  . Dysesthesia 01/05/2019  . Other fatigue  01/05/2019  . Anxiety 09/21/2018  . Shifting  sleep-work schedule 09/21/2018  . Multiple sclerosis (HCC) 09/02/2018  . Pain in left hip 12/04/2017  . Chronic right-sided low back pain with right-sided sciatica 11/18/2017  . Degenerative disc disease, lumbar 11/18/2017  . Idiopathic scoliosis in adult patient 11/18/2017  . Depression, recurrent (HCC) 12/13/2016    Adaia Matthies,[name], PTA 12/09/2019, 5:44 PM  [facility] [address] [place], [place], [address] Phone: [phone]   Fax:  [phone]  Name: Isaac Scott MRN: [id number] Date of Birth: Jun 11, 1991

## 2019-12-21 ENCOUNTER — Encounter: Payer: 59 | Admitting: *Deleted

## 2019-12-21 DIAGNOSIS — Z0289 Encounter for other administrative examinations: Secondary | ICD-10-CM

## 2019-12-23 ENCOUNTER — Encounter: Payer: 59 | Admitting: *Deleted

## 2019-12-28 ENCOUNTER — Ambulatory Visit: Payer: 59 | Admitting: *Deleted

## 2019-12-28 DIAGNOSIS — R2681 Unsteadiness on feet: Secondary | ICD-10-CM

## 2019-12-28 DIAGNOSIS — M6281 Muscle weakness (generalized): Secondary | ICD-10-CM

## 2019-12-28 DIAGNOSIS — M25562 Pain in left knee: Secondary | ICD-10-CM

## 2019-12-28 DIAGNOSIS — R296 Repeated falls: Secondary | ICD-10-CM

## 2019-12-28 DIAGNOSIS — R262 Difficulty in walking, not elsewhere classified: Secondary | ICD-10-CM

## 2019-12-28 NOTE — Therapy (Signed)
[facility] [address] [place], [place], [address] Phone: [phone]   Fax:  [phone]  Physical Therapy Treatment  Patient Details  Name: Isaac Scott MRN: [id number] Date of Birth: 1991/02/04 Referring Provider (PT): Arville Care, MD   Encounter Date: 12/28/2019  PT End of Session - 12/28/19 1745    Visit Number  9    Number of Visits  16    Date for PT Re-Evaluation  12/24/19    Authorization Type  Progress note every 10th visit    PT Start Time  1645    PT Stop Time  1733    PT Time Calculation (min)  48 min       Past Medical History:  Diagnosis Date  . Anxiety   . Depression   . Generalized headaches   . Hemorrhoids   . Multiple sclerosis (HCC)     Past Surgical History:  Procedure Laterality Date  . TOTAL HIP ARTHROPLASTY     Left     There were no vitals filed for this visit.  Subjective Assessment - 12/28/19 1706    Subjective  COVID-19 screening performed upon arrival. Doing ok today    Pertinent History  Multiple sclerosis (diagnoses 2019), left THA    Limitations  Walking;House hold activities;Standing    Patient Stated Goals  improve movement, improve balance, improve strength    Currently in Pain?  No/denies                        [facility] Adult PT Treatment/Exercise - 12/28/19 0001      Exercises   Exercises  Lumbar;Knee/Hip;Shoulder      Lumbar Exercises: Aerobic   Nustep  L4  x 11 mins UE/LE activity      Lumbar Exercises: Standing   Other Standing Lumbar Exercises  XTS blue SLS shldr extension  2x10 on each LE      Knee/Hip Exercises: Standing   SLS  SLS x 3 bouts each side    Other Standing Knee Exercises  standing on inverted BOSU NBOS  2x10 mini-quats,  1 min perturbations with intermittent UE support    Other Standing Knee Exercises  , 4# ball lifts with SLS 2 x 10 each,  side stepping on balance beam x 3 down and back with 6# ball forward reaches.       Shoulder Exercises: ROM/Strengthening   UBE (Upper Arm Bike)  60 RPMS x 5 mins 4' fwd, 4' bwd          Balance Exercises - 12/28/19 1715      Balance Exercises: Standing   Rockerboard  Anterior/posterior;Lateral;Other time (comment);Intermittent UE support    Balance Beam  tandem walk x3 mins, lateral stepping x3 mins             PT Long Term Goals - 12/09/19 1705      PT LONG TERM GOAL #1   Title  Patient will be independent with HEP    Time  8    Period  Weeks    Status  On-going      PT LONG TERM GOAL #2   Title  Patient will demonstrate 4/5 or greater LE and UE MMT to improve stability during functional tasks.    Time  8    Period  Weeks    Status  On-going      PT LONG TERM GOAL #3   Title  Patient will perform 5x sit to  stand test in 15 seconds or less to improve functional LE strength and decrease risk of falls.    Time  8    Period  Weeks    Status  Achieved      PT LONG TERM GOAL #4   Title  Patient will improve Berg Balance Scale by 5 points or greater to decrease risk of falls.    Time  8    Period  Weeks    Status  On-going            Plan - 12/28/19 1747    Clinical Impression Statement  Pt arrived today doing fairly well with good energy. He was guided through a progression of  balance and LE strengthening exs and did well.    Personal Factors and Comorbidities  Comorbidity 1    Comorbidities  Multiple sclerosis, R THA    Examination-Activity Limitations  Stand;Stairs;Locomotion Level;Transfers;Carry    Stability/Clinical Decision Making  Stable/Uncomplicated    Rehab Potential  Good    PT Frequency  2x / week    PT Duration  8 weeks    PT Treatment/Interventions  ADLs/Self Care Home Management;Cryotherapy;Electrical Stimulation;Moist Heat;Gait training;Stair training;Functional mobility training;Therapeutic activities;Therapeutic exercise;Balance training;Neuromuscular re-education;Passive range of motion;Patient/family education    PT  Next Visit Plan  nustep or bike, LE strengthening, balance TEs in various positions, cautious of fatigue and overheating secondary to MS       Patient will benefit from skilled therapeutic intervention in order to improve the following deficits and impairments:  Abnormal gait, Difficulty walking, Decreased activity tolerance, Decreased balance, Decreased strength, Postural dysfunction, Pain  Visit Diagnosis: Repeated falls  Unsteadiness on feet  Difficulty in walking, not elsewhere classified  Acute pain of left knee  Muscle weakness (generalized)     Problem List Patient Active Problem List   Diagnosis Date Noted  . Other headache syndrome 02/24/2019  . Dysesthesia 01/05/2019  . Other fatigue 01/05/2019  . Anxiety 09/21/2018  . Shifting sleep-work schedule 09/21/2018  . Multiple sclerosis ([place]) 09/02/2018  . Pain in left hip 12/04/2017  . Chronic right-sided low back pain with right-sided sciatica 11/18/2017  . Degenerative disc disease, lumbar 11/18/2017  . Idiopathic scoliosis in adult patient 11/18/2017  . Depression, recurrent ([place]) 12/13/2016    Myiah Petkus,[name], PTA 12/28/2019, 6:05 PM  [facility] [place], [place], [address] Phone: [phone]   Fax:  [phone]  Name: Isaac Scott MRN: [id number] Date of Birth: Apr 02, 1991

## 2019-12-30 ENCOUNTER — Other Ambulatory Visit: Payer: Self-pay

## 2019-12-30 ENCOUNTER — Ambulatory Visit: Payer: 59 | Admitting: *Deleted

## 2019-12-30 DIAGNOSIS — R2681 Unsteadiness on feet: Secondary | ICD-10-CM

## 2019-12-30 DIAGNOSIS — R296 Repeated falls: Secondary | ICD-10-CM | POA: Diagnosis not present

## 2019-12-30 DIAGNOSIS — R262 Difficulty in walking, not elsewhere classified: Secondary | ICD-10-CM

## 2019-12-30 NOTE — Therapy (Signed)
Isaac Scott, [place], [address] Phone: [phone]   Fax:  [phone]  Physical Therapy Treatment Progress Note Reporting Period 10/22/2019 to 12/30/2019  See note below for Objective Data and Assessment of Progress/Goals. Patient is making progress toward goals but still limitied due to fatigue from [place]. Gabriela Eves, PT, DPT     Patient Details  Name: Isaac Scott MRN: [id number] Date of Birth: [birth year] Referring Provider (PT): Caryl Pina, MD   Encounter Date: 12/30/2019  PT End of Session - 12/30/19 1654    Visit Number  10    Number of Visits  16    Date for PT Re-Evaluation  01/28/20    Authorization Type  Progress note every 10th visit    PT Start Time  1645    PT Stop Time  1732    PT Time Calculation (min)  47 min       Past Medical History:  Diagnosis Date  . Anxiety   . Depression   . Generalized headaches   . Hemorrhoids   . Multiple sclerosis ([place])     Past Surgical History:  Procedure Laterality Date  . TOTAL HIP ARTHROPLASTY     Left     There were no vitals filed for this visit.  Subjective Assessment - 12/30/19 1652    Subjective  COVID-19 screening performed upon arrival. Less energy today.    Pertinent History  Multiple sclerosis (diagnoses 2019), left THA    Diagnostic tests  MRI    Patient Stated Goals  improve movement, improve balance, improve strength    Currently in Pain?  No/denies                        [facility] Adult PT Treatment/Exercise - 12/30/19 0001      Exercises   Exercises  Lumbar;Knee/Hip;Shoulder      Lumbar Exercises: Aerobic   Nustep  L4  x 10 mins UE/LE activity      Lumbar Exercises: Standing   Other Standing Lumbar Exercises  XTS blue SLS shldr extension  2x10 on each LE      Knee/Hip Exercises: Standing   SLS  SLS x 3 bouts each side    Other Standing Knee Exercises  standing on inverted BOSU NBOS  2x10 mini-quats,  with intermittent UE support    Other Standing Knee Exercises   4# ball lifts with SLS 2 x 10 each at wall  side stepping on balance beam x 3 down and back with 6# ball forward reaches.       Shoulder Exercises: ROM/Strengthening   UBE (Upper Arm Bike)  60 RPMS x 5 mins 4' fwd, 4' bwd                  PT Long Term Goals - 12/30/19 1749      PT LONG TERM GOAL #1   Title  Patient will be independent with HEP    Time  8    Period  Weeks    Status  On-going      PT LONG TERM GOAL #2   Title  Patient will demonstrate 4/5 or greater LE and UE MMT to improve stability during functional tasks.    Time  8    Period  Weeks    Status  On-going      PT LONG TERM GOAL #3   Title  Patient will perform 5x sit to stand test in  15 seconds or less to improve functional LE strength and decrease risk of falls.    Time  8    Period  Weeks      PT LONG TERM GOAL #4   Title  Patient will improve Berg Balance Scale by 5 points or greater to decrease risk of falls.    Time  8    Period  Weeks    Status  Achieved            Plan - 12/30/19 1750    Clinical Impression Statement  Pt arrived today doing fair with somewhat less energy than last Rx, due to high temps outside. He was able to perform strengthening and balance Act's with short rest breaks as needed. He has met 2 LTGs, but not LTG for strength yet due to mild deficit.    Personal Factors and Comorbidities  Comorbidity 1    Comorbidities  Multiple sclerosis, R THA    Examination-Activity Limitations  Stand;Stairs;Locomotion Level;Transfers;Carry    Stability/Clinical Decision Making  Stable/Uncomplicated    Rehab Potential  Good    PT Frequency  2x / week    PT Duration  8 weeks    PT Treatment/Interventions  ADLs/Self Care Home Management;Cryotherapy;Electrical Stimulation;Moist Heat;Gait training;Stair training;Functional mobility training;Therapeutic activities;Therapeutic exercise;Balance training;Neuromuscular  re-education;Passive range of motion;Patient/family education    PT Next Visit Plan  nustep or bike, LE strengthening, balance TEs in various positions, cautious of fatigue and overheating secondary to MS    2 visits left       Patient will benefit from skilled therapeutic intervention in order to improve the following deficits and impairments:  Abnormal gait, Difficulty walking, Decreased activity tolerance, Decreased balance, Decreased strength, Postural dysfunction, Pain  Visit Diagnosis: Repeated falls  Unsteadiness on feet  Difficulty in walking, not elsewhere classified     Problem List Patient Active Problem List   Diagnosis Date Noted  . Other headache syndrome 02/24/2019  . Dysesthesia 01/05/2019  . Other fatigue 01/05/2019  . Anxiety 09/21/2018  . Shifting sleep-work schedule 09/21/2018  . Multiple sclerosis ([place]) 09/02/2018  . Pain in left hip 12/04/2017  . Chronic right-sided low back pain with right-sided sciatica 11/18/2017  . Degenerative disc disease, lumbar 11/18/2017  . Idiopathic scoliosis in adult patient 11/18/2017  . Depression, recurrent ([place]) 12/13/2016    RAMSEUR,[name], PTA 12/30/2019, 5:55 PM  [facility] [place], [place], [address] Phone: [phone]   Fax:  [phone]  Name: Isaac Scott MRN: [id number] Date of Birth: Dec 30, 1990

## 2020-01-04 ENCOUNTER — Other Ambulatory Visit: Payer: Self-pay

## 2020-01-04 ENCOUNTER — Encounter: Payer: Self-pay | Admitting: Physical Therapy

## 2020-01-04 ENCOUNTER — Ambulatory Visit: Payer: 59 | Attending: Family Medicine | Admitting: Physical Therapy

## 2020-01-04 DIAGNOSIS — R262 Difficulty in walking, not elsewhere classified: Secondary | ICD-10-CM | POA: Insufficient documentation

## 2020-01-04 DIAGNOSIS — R2681 Unsteadiness on feet: Secondary | ICD-10-CM | POA: Diagnosis present

## 2020-01-04 DIAGNOSIS — M6281 Muscle weakness (generalized): Secondary | ICD-10-CM | POA: Diagnosis present

## 2020-01-04 DIAGNOSIS — R296 Repeated falls: Secondary | ICD-10-CM | POA: Diagnosis not present

## 2020-01-04 DIAGNOSIS — M25562 Pain in left knee: Secondary | ICD-10-CM | POA: Diagnosis present

## 2020-01-04 NOTE — Therapy (Signed)
[facility] [place], [place], [address] Phone: [phone]   Fax:  [phone]  Physical Therapy Treatment  Patient Details  Name: Isaac Scott MRN: [id number] Date of Birth: [birth year] Referring Provider (PT): Caryl Pina, MD   Encounter Date: 01/04/2020  PT End of Session - 01/04/20 1809    Visit Number  11    Number of Visits  16    Date for PT Re-Evaluation  01/28/20    Authorization Type  Progress note every 10th visit    PT Start Time  1645    PT Stop Time  1735    PT Time Calculation (min)  50 min    Activity Tolerance  Patient tolerated treatment well    Behavior During Therapy  [facility] for tasks assessed/performed       Past Medical History:  Diagnosis Date  . Anxiety   . Depression   . Generalized headaches   . Hemorrhoids   . Multiple sclerosis ([place])     Past Surgical History:  Procedure Laterality Date  . TOTAL HIP ARTHROPLASTY     Left     There were no vitals filed for this visit.  Subjective Assessment - 01/04/20 1808    Subjective  COVID-19 screening performed upon arrival. No pain reported. Pt reporting the heat outside makes his symptoms a little worse and increases his fatigue.    Pertinent History  Multiple sclerosis (diagnoses 2019), left THA    Limitations  Walking;House hold activities;Standing    Diagnostic tests  MRI    Patient Stated Goals  improve movement, improve balance, improve strength    Currently in Pain?  No/denies                        [facility] Adult PT Treatment/Exercise - 01/04/20 0001      Lumbar Exercises: Aerobic   Nustep  L4  x 10 mins UE/LE activity      Lumbar Exercises: Standing   Other Standing Lumbar Exercises  XTS blue SLS shldr extension  2x10 on each LE      Knee/Hip Exercises: Standing   Other Standing Knee Exercises  standing tapping opposite hand to knee alternating x 20 reps      Knee/Hip Exercises: Seated   Other Seated  Knee/Hip Exercises  hamstring curls x 10 using green theraband          Balance Exercises - 01/04/20 1803      Balance Exercises: Standing   Rockerboard  Anterior/posterior;30 seconds    Sidestepping  Head turns;5 reps;Other reps (comment);Theraband    Theraband Level (Sidestepping)  Level 3 (Green)    Heel Raises  Both;20 reps    Toe Raise  20 reps    Other Standing Exercises  vector tapping on balance pods bilateral LE's,     Other Standing Exercises Comments  lunges x 20 alternating LE's        PT Education - 01/04/20 1808    Education Details  Discussed importance of stretching his LE's hamstrings and heel cords.    Person(s) Educated  Patient    Methods  Explanation;Demonstration    Comprehension  Verbalized understanding;Returned demonstration          PT Long Term Goals - 01/04/20 1814      PT LONG TERM GOAL #1   Title  Patient will be independent with HEP    Status  On-going      PT LONG TERM  GOAL #2   Title  Patient will demonstrate 4/5 or greater LE and UE MMT to improve stability during functional tasks.    Status  On-going      PT LONG TERM GOAL #3   Title  Patient will perform 5x sit to stand test in 15 seconds or less to improve functional LE strength and decrease risk of falls.    Status  Achieved      PT LONG TERM GOAL #4   Title  Patient will improve Berg Balance Scale by 5 points or greater to decrease risk of falls.    Status  Achieved            Plan - 01/04/20 1810    Clinical Impression Statement  Pt arriving reporting he feels like his next visit will be his last and he wants to continue his HEP on his own. We discussed placing pt on hold after next visit. Pt has met two of his LTG's but still progressing with strength. We discussed importance of maintaining a HEP and stretching due to clonus noted in R foot/ankle. Pt to return on Thursday for upgraded HEP for balance and general stretching and strengthening.    Personal Factors and  Comorbidities  Comorbidity 1    Comorbidities  Multiple sclerosis, R THA    Examination-Activity Limitations  Stand;Stairs;Locomotion Level;Transfers;Carry    Stability/Clinical Decision Making  Stable/Uncomplicated    Rehab Potential  Good    PT Frequency  2x / week    PT Duration  8 weeks    PT Treatment/Interventions  ADLs/Self Care Home Management;Cryotherapy;Electrical Stimulation;Moist Heat;Gait training;Stair training;Functional mobility training;Therapeutic activities;Therapeutic exercise;Balance training;Neuromuscular re-education;Passive range of motion;Patient/family education    PT Next Visit Plan  nustep or bike, LE strengthening, balance TEs in various positions, cautious of fatigue and overheating secondary to MS    2 visits left    PT Home Exercise Plan  see patient education section    Consulted and Agree with Plan of Care  Patient       Patient will benefit from skilled therapeutic intervention in order to improve the following deficits and impairments:  Abnormal gait, Difficulty walking, Decreased activity tolerance, Decreased balance, Decreased strength, Postural dysfunction, Pain  Visit Diagnosis: Repeated falls  Unsteadiness on feet  Difficulty in walking, not elsewhere classified  Acute pain of left knee  Muscle weakness (generalized)     Problem List Patient Active Problem List   Diagnosis Date Noted  . Other headache syndrome 02/24/2019  . Dysesthesia 01/05/2019  . Other fatigue 01/05/2019  . Anxiety 09/21/2018  . Shifting sleep-work schedule 09/21/2018  . Multiple sclerosis ([place]) 09/02/2018  . Pain in left hip 12/04/2017  . Chronic right-sided low back pain with right-sided sciatica 11/18/2017  . Degenerative disc disease, lumbar 11/18/2017  . Idiopathic scoliosis in adult patient 11/18/2017  . Depression, recurrent ([place]) 12/13/2016    Oretha Caprice , PT, MPT 01/04/2020, 6:15 PM  [facility] [place], [place], [address] Phone: [phone]   Fax:  [phone]  Name: Isaac Scott MRN: [id number] Date of Birth: 1990/10/24

## 2020-01-06 ENCOUNTER — Ambulatory Visit: Payer: 59 | Admitting: Physical Therapy

## 2020-01-06 ENCOUNTER — Encounter: Payer: Self-pay | Admitting: Physical Therapy

## 2020-01-06 ENCOUNTER — Other Ambulatory Visit: Payer: Self-pay

## 2020-01-06 DIAGNOSIS — R296 Repeated falls: Secondary | ICD-10-CM | POA: Diagnosis not present

## 2020-01-06 DIAGNOSIS — R2681 Unsteadiness on feet: Secondary | ICD-10-CM

## 2020-01-06 DIAGNOSIS — R262 Difficulty in walking, not elsewhere classified: Secondary | ICD-10-CM

## 2020-01-06 DIAGNOSIS — M25562 Pain in left knee: Secondary | ICD-10-CM

## 2020-01-06 DIAGNOSIS — M6281 Muscle weakness (generalized): Secondary | ICD-10-CM

## 2020-01-06 NOTE — Therapy (Signed)
[facility] [address] [place], [place], [address] Phone: [phone]   Fax:  [phone]  Physical Therapy Treatment  Patient Details  Name: Isaac Scott MRN: [id number] Date of Birth: 1991-03-31 Referring Provider (PT): Arville Care, MD   Encounter Date: 01/06/2020  PT End of Session - 01/06/20 1700    Visit Number  12    Number of Visits  16    Date for PT Re-Evaluation  01/28/20    Authorization Type  Progress note every 10th visit    PT Start Time  1651    PT Stop Time  1730    PT Time Calculation (min)  39 min    Activity Tolerance  Patient tolerated treatment well    Behavior During Therapy  [facility] for tasks assessed/performed       Past Medical History:  Diagnosis Date  . Anxiety   . Depression   . Generalized headaches   . Hemorrhoids   . Multiple sclerosis (HCC)     Past Surgical History:  Procedure Laterality Date  . TOTAL HIP ARTHROPLASTY     Left     There were no vitals filed for this visit.  Subjective Assessment - 01/06/20 1652    Subjective  COVID 19 screening performed on patient upon arrival. Wishes to continue PT. Patient states that he has good days and bad days in regards to balance.    Pertinent History  Multiple sclerosis (diagnoses 2019), left THA    Limitations  Walking;House hold activities;Standing    Diagnostic tests  MRI    Patient Stated Goals  improve movement, improve balance, improve strength    Currently in Pain?  No/denies         [facility] PT Assessment - 01/06/20 0001      Assessment   Medical Diagnosis  Multiple Sclerosis    Referring Provider (PT)  Arville Care, MD    Hand Dominance  Right    Next MD Visit  n/a    Prior Therapy  no      Precautions   Precautions  Fall      Restrictions   Weight Bearing Restrictions  No                    [facility] Adult PT Treatment/Exercise - 01/06/20 0001      Knee/Hip Exercises: Aerobic   Nustep  L4 x10 min       Knee/Hip Exercises: Standing   Rocker Board  5 minutes   for stretching         Balance Exercises - 01/06/20 0001      Balance Exercises: Standing   Tandem Stance  Eyes open;Foam/compliant surface;Intermittent upper extremity support;Time   x3 min   SLS with Vectors  Solid surface;5 reps   balance pods   Step Ups  Forward;6 inch;Intermittent UE support   x20 reps BLE   Tandem Gait  Forward;Intermittent upper extremity support;Foam/compliant surface;5 reps    Sidestepping  Foam/compliant support;4 reps    Cone Rotation  Foam/compliant surface;R/L   2# ball   Sit to Stand  Standard surface;Without upper extremity support   BLE SL sit to stands            PT Long Term Goals - 01/04/20 1814      PT LONG TERM GOAL #1   Title  Patient will be independent with HEP    Status  On-going      PT LONG TERM GOAL #2  Title  Patient will demonstrate 4/5 or greater LE and UE MMT to improve stability during functional tasks.    Status  On-going      PT LONG TERM GOAL #3   Title  Patient will perform 5x sit to stand test in 15 seconds or less to improve functional LE strength and decrease risk of falls.    Status  Achieved      PT LONG TERM GOAL #4   Title  Patient will improve Berg Balance Scale by 5 points or greater to decrease risk of falls.    Status  Achieved            Plan - 01/06/20 1739    Clinical Impression Statement  Patient presented in clinic with knowledge of his limitations regards MS and encouraged by PTA to continue HS and gastroc stretching daily. Patient progressed with balance activities that included functional reaching or turning while on uneven surface. Very minimal and intermittant UE support required today. By end of treatment RLE fatigue reported by patient.    Personal Factors and Comorbidities  Comorbidity 1    Comorbidities  Multiple sclerosis, R THA    Examination-Activity Limitations  Stand;Stairs;Locomotion Level;Transfers;Carry     Stability/Clinical Decision Making  Stable/Uncomplicated    Rehab Potential  Good    PT Frequency  2x / week    PT Duration  8 weeks    PT Treatment/Interventions  ADLs/Self Care Home Management;Cryotherapy;Electrical Stimulation;Moist Heat;Gait training;Stair training;Functional mobility training;Therapeutic activities;Therapeutic exercise;Balance training;Neuromuscular re-education;Passive range of motion;Patient/family education    PT Next Visit Plan  Continue balance progression.    PT Home Exercise Plan  see patient education section    Consulted and Agree with Plan of Care  Patient       Patient will benefit from skilled therapeutic intervention in order to improve the following deficits and impairments:  Abnormal gait, Difficulty walking, Decreased activity tolerance, Decreased balance, Decreased strength, Postural dysfunction, Pain  Visit Diagnosis: Repeated falls  Unsteadiness on feet  Difficulty in walking, not elsewhere classified  Acute pain of left knee  Muscle weakness (generalized)     Problem List Patient Active Problem List   Diagnosis Date Noted  . Other headache syndrome 02/24/2019  . Dysesthesia 01/05/2019  . Other fatigue 01/05/2019  . Anxiety 09/21/2018  . Shifting sleep-work schedule 09/21/2018  . Multiple sclerosis ([place]) 09/02/2018  . Pain in left hip 12/04/2017  . Chronic right-sided low back pain with right-sided sciatica 11/18/2017  . Degenerative disc disease, lumbar 11/18/2017  . Idiopathic scoliosis in adult patient 11/18/2017  . Depression, recurrent ([place]) 12/13/2016    Standley Brooking, PTA 01/06/2020, 5:43 PM  [facility] [address] [place], [place], [address] Phone: [phone]   Fax:  [phone]  Name: Isaac Scott MRN: [id number] Date of Birth: [birth year]

## 2020-01-11 ENCOUNTER — Other Ambulatory Visit: Payer: Self-pay | Admitting: Neurology

## 2020-01-15 ENCOUNTER — Encounter: Payer: Self-pay | Admitting: Family Medicine

## 2020-01-18 ENCOUNTER — Other Ambulatory Visit: Payer: Self-pay

## 2020-01-18 ENCOUNTER — Ambulatory Visit: Payer: 59 | Admitting: Physical Therapy

## 2020-01-18 ENCOUNTER — Encounter: Payer: Self-pay | Admitting: Physical Therapy

## 2020-01-18 DIAGNOSIS — R262 Difficulty in walking, not elsewhere classified: Secondary | ICD-10-CM

## 2020-01-18 DIAGNOSIS — R296 Repeated falls: Secondary | ICD-10-CM | POA: Diagnosis not present

## 2020-01-18 DIAGNOSIS — R2681 Unsteadiness on feet: Secondary | ICD-10-CM

## 2020-01-18 NOTE — Therapy (Signed)
[facility] [address] [place], [place], [address] Phone: [phone]   Fax:  [phone]  Physical Therapy Treatment  Patient Details  Name: Isaac Scott MRN: [id number] Date of Birth: 08-25-1990 Referring Provider (PT): Arville Care, MD   Encounter Date: 01/18/2020   PT End of Session - 01/18/20 1742    Visit Number 13    Number of Visits 16    Date for PT Re-Evaluation 01/28/20    Authorization Type Progress note every 10th visit    PT Start Time 1645    PT Stop Time 1731    PT Time Calculation (min) 46 min    Activity Tolerance Patient tolerated treatment well    Behavior During Therapy [facility] for tasks assessed/performed           Past Medical History:  Diagnosis Date  . Anxiety   . Depression   . Generalized headaches   . Hemorrhoids   . Multiple sclerosis (HCC)     Past Surgical History:  Procedure Laterality Date  . TOTAL HIP ARTHROPLASTY     Left     There were no vitals filed for this visit.   Subjective Assessment - 01/18/20 1653    Subjective COVID-19 screening performed upon arrival. Reports he may have fractured his left toe after a pole fell on his foot.    Pertinent History Multiple sclerosis (diagnoses 2019), left THA    Limitations Walking;House hold activities;Standing    Diagnostic tests MRI    Patient Stated Goals improve movement, improve balance, improve strength    Currently in Pain? No/denies              [facility] PT Assessment - 01/18/20 0001      Assessment   Medical Diagnosis Multiple Sclerosis    Referring Provider (PT) Arville Care, MD    Hand Dominance Right    Next MD Visit n/a    Prior Therapy no      Precautions   Precautions [facility] Adult PT Treatment/Exercise - 01/18/20 0001      Knee/Hip Exercises: Stretches   Passive Hamstring Stretch Both;3 reps;30 seconds    Quad Stretch Both;2 reps;30 seconds    ITB Stretch  Both;3 reps;30 seconds      Knee/Hip Exercises: Aerobic   Nustep L4 x12 min      Knee/Hip Exercises: Supine   Bridges Strengthening;Both;2 sets;10 reps    Bridges Limitations on red theraball    Straight Leg Raises Both;10 reps;2 sets;Strengthening    Straight Leg Raises Limitations 2#      Knee/Hip Exercises: Sidelying   Hip ABduction AROM;Both;2 sets;10 reps    Hip ABduction Limitations 2#    Hip ADduction AROM;Both;2 sets;10 reps    Hip ADduction Limitations 2#                       PT Long Term Goals - 01/04/20 1814      PT LONG TERM GOAL #1   Title Patient will be independent with HEP    Status On-going      PT LONG TERM GOAL #2   Title Patient will demonstrate 4/5 or greater LE and UE MMT to improve stability during functional tasks.    Status On-going      PT LONG TERM GOAL #3   Title Patient will  perform 5x sit to stand test in 15 seconds or less to improve functional LE strength and decrease risk of falls.    Status Achieved      PT LONG TERM GOAL #4   Title Patient will improve Berg Balance Scale by 5 points or greater to decrease risk of falls.    Status Achieved                 Plan - 01/18/20 1742    Clinical Impression Statement Due to a possible R toe fracture, patient's exercises were performed in supine but still with progressions with ankle weights. Patient reported feeling fatigue but overall did well with all exercises. Intermittent cuing required for form and technique. Strong carryover for remaining reps.    Personal Factors and Comorbidities Comorbidity 1    Comorbidities Multiple sclerosis, R THA    Examination-Activity Limitations Stand;Stairs;Locomotion Level;Transfers;Carry    Stability/Clinical Decision Making Stable/Uncomplicated    Clinical Decision Making Low    Rehab Potential Good    PT Frequency 2x / week    PT Duration 8 weeks    PT Treatment/Interventions ADLs/Self Care Home Management;Cryotherapy;Electrical  Stimulation;Moist Heat;Gait training;Stair training;Functional mobility training;Therapeutic activities;Therapeutic exercise;Balance training;Neuromuscular re-education;Passive range of motion;Patient/family education    PT Next Visit Plan Continue balance progression.    PT Home Exercise Plan see patient education section    Consulted and Agree with Plan of Care Patient           Patient will benefit from skilled therapeutic intervention in order to improve the following deficits and impairments:  Abnormal gait, Difficulty walking, Decreased activity tolerance, Decreased balance, Decreased strength, Postural dysfunction, Pain  Visit Diagnosis: Repeated falls  Unsteadiness on feet  Difficulty in walking, not elsewhere classified     Problem List Patient Active Problem List   Diagnosis Date Noted  . Other headache syndrome 02/24/2019  . Dysesthesia 01/05/2019  . Other fatigue 01/05/2019  . Anxiety 09/21/2018  . Shifting sleep-work schedule 09/21/2018  . Multiple sclerosis ([place]) 09/02/2018  . Pain in left hip 12/04/2017  . Chronic right-sided low back pain with right-sided sciatica 11/18/2017  . Degenerative disc disease, lumbar 11/18/2017  . Idiopathic scoliosis in adult patient 11/18/2017  . Depression, recurrent ([place]) 12/13/2016    Gabriela Eves, PT, DPT 01/18/2020, 5:45 PM  [facility] [place], [place], [address] Phone: [phone]   Fax:  [phone]  Name: Isaac Scott MRN: [id number] Date of Birth: May 09, 1991

## 2020-01-20 ENCOUNTER — Encounter: Payer: 59 | Admitting: *Deleted

## 2020-01-25 ENCOUNTER — Ambulatory Visit: Payer: 59 | Admitting: Physical Therapy

## 2020-01-25 ENCOUNTER — Other Ambulatory Visit: Payer: Self-pay

## 2020-01-25 ENCOUNTER — Encounter: Payer: Self-pay | Admitting: Physical Therapy

## 2020-01-25 DIAGNOSIS — R296 Repeated falls: Secondary | ICD-10-CM | POA: Diagnosis not present

## 2020-01-25 DIAGNOSIS — R2681 Unsteadiness on feet: Secondary | ICD-10-CM

## 2020-01-25 DIAGNOSIS — R262 Difficulty in walking, not elsewhere classified: Secondary | ICD-10-CM

## 2020-01-25 NOTE — Therapy (Signed)
[facility] [place], [place], [address] Phone: [phone]   Fax:  [phone]  Physical Therapy Treatment  Patient Details  Name: Isaac Scott MRN: [id number] Date of Birth: Jun 28, 1991 Referring Provider (PT): Caryl Pina, MD   Encounter Date: 01/25/2020   PT End of Session - 01/25/20 1648    Visit Number 14    Number of Visits 16    Date for PT Re-Evaluation 01/28/20    Authorization Type Progress note every 10th visit    PT Start Time 1645    PT Stop Time 1733    PT Time Calculation (min) 48 min    Activity Tolerance Patient tolerated treatment well    Behavior During Therapy [facility] for tasks assessed/performed           Past Medical History:  Diagnosis Date  . Anxiety   . Depression   . Generalized headaches   . Hemorrhoids   . Multiple sclerosis ([place])     Past Surgical History:  Procedure Laterality Date  . TOTAL HIP ARTHROPLASTY     Left     There were no vitals filed for this visit.   Subjective Assessment - 01/25/20 1647    Subjective COVID-19 screening performed upon arrival. No new falls reported and reports his toe is healing some.    Pertinent History Multiple sclerosis (diagnoses 2019), left THA    Limitations Walking;House hold activities;Standing    Diagnostic tests MRI    Patient Stated Goals improve movement, improve balance, improve strength    Currently in Pain? No/denies              [facility] PT Assessment - 01/25/20 0001      Assessment   Medical Diagnosis Multiple Sclerosis    Referring Provider (PT) Caryl Pina, MD    Hand Dominance Right    Next MD Visit n/a    Prior Therapy no      Precautions   Precautions [facility] Adult PT Treatment/Exercise - 01/25/20 0001      Knee/Hip Exercises: Stretches   Passive Hamstring Stretch Both;2 reps;60 seconds    Gastroc Stretch Both;2 reps;60 seconds      Knee/Hip Exercises:  Aerobic   Nustep L4 x12 min      Knee/Hip Exercises: Machines for Strengthening   Cybex Knee Extension 20# 3x10 reps    Cybex Knee Flexion 40# 3x10 reps      Knee/Hip Exercises: Standing   Walking with Sports Cord Green XTS x10 reps backwards, B lateral      Knee/Hip Exercises: Supine   Bridges Strengthening;Both;2 sets;10 reps    Bridges Limitations on dynadisc    Straight Leg Raises Both;10 reps;2 sets;Strengthening    Straight Leg Raises Limitations 2#      Knee/Hip Exercises: Sidelying   Hip ABduction Strengthening;Both;2 sets;10 reps    Hip ABduction Limitations 2#                       PT Long Term Goals - 01/25/20 1719      PT LONG TERM GOAL #1   Title Patient will be independent with HEP    Status Partially Met      PT LONG TERM GOAL #2   Title Patient will demonstrate 4/5 or greater LE and UE MMT to improve stability during functional  tasks.    Status On-going      PT LONG TERM GOAL #3   Title Patient will perform 5x sit to stand test in 15 seconds or less to improve functional LE strength and decrease risk of falls.    Status Achieved      PT LONG TERM GOAL #4   Title Patient will improve Berg Balance Scale by 5 points or greater to decrease risk of falls.    Status Achieved                 Plan - 01/25/20 1758    Clinical Impression Statement Patient presented in clinic with reports of LE buckling but after prolonged standing or activity. Patient fatigued with weighted exercises and core challenged by bridges on dynadisc. More strengthening completed today for remaining goal but also to allow prolonged standing without LE buckling.    Personal Factors and Comorbidities Comorbidity 1    Comorbidities Multiple sclerosis, R THA    Examination-Activity Limitations Stand;Stairs;Locomotion Level;Transfers;Carry    Stability/Clinical Decision Making Stable/Uncomplicated    Rehab Potential Good    PT Frequency 2x / week    PT Duration 8 weeks     PT Treatment/Interventions ADLs/Self Care Home Management;Cryotherapy;Electrical Stimulation;Moist Heat;Gait training;Stair training;Functional mobility training;Therapeutic activities;Therapeutic exercise;Balance training;Neuromuscular re-education;Passive range of motion;Patient/family education    PT Next Visit Plan Continue balance progression.    PT Home Exercise Plan see patient education section    Consulted and Agree with Plan of Care Patient           Patient will benefit from skilled therapeutic intervention in order to improve the following deficits and impairments:  Abnormal gait, Difficulty walking, Decreased activity tolerance, Decreased balance, Decreased strength, Postural dysfunction, Pain  Visit Diagnosis: Repeated falls  Unsteadiness on feet  Difficulty in walking, not elsewhere classified     Problem List Patient Active Problem List   Diagnosis Date Noted  . Other headache syndrome 02/24/2019  . Dysesthesia 01/05/2019  . Other fatigue 01/05/2019  . Anxiety 09/21/2018  . Shifting sleep-work schedule 09/21/2018  . Multiple sclerosis ([place]) 09/02/2018  . Pain in left hip 12/04/2017  . Chronic right-sided low back pain with right-sided sciatica 11/18/2017  . Degenerative disc disease, lumbar 11/18/2017  . Idiopathic scoliosis in adult patient 11/18/2017  . Depression, recurrent ([place]) 12/13/2016    Standley Brooking, PTA 01/25/2020, [facility] [place], [place], [address] Phone: [phone]   Fax:  [phone]  Name: Isaac Scott MRN: [id number] Date of Birth: 1991-01-09

## 2020-01-27 ENCOUNTER — Ambulatory Visit: Payer: 59 | Admitting: *Deleted

## 2020-01-27 ENCOUNTER — Other Ambulatory Visit: Payer: Self-pay

## 2020-01-27 DIAGNOSIS — R296 Repeated falls: Secondary | ICD-10-CM

## 2020-01-27 DIAGNOSIS — R2681 Unsteadiness on feet: Secondary | ICD-10-CM

## 2020-01-27 DIAGNOSIS — R262 Difficulty in walking, not elsewhere classified: Secondary | ICD-10-CM

## 2020-01-27 NOTE — Therapy (Signed)
[facility] [place], [place], [address] Phone: [phone]   Fax:  [phone]  Physical Therapy Treatment PHYSICAL THERAPY DISCHARGE SUMMARY  Visits from Start of Care: 15  Current functional level related to goals / functional outcomes: See below   Remaining deficits: See goals.   Education / Equipment: HEP Plan: Patient agrees to discharge.  Patient goals were not met. Patient is being discharged due to being pleased with the current functional level.  ?????     Patient Details  Name: Isaac Scott MRN: [id number] Date of Birth: 05/19/1991 Referring Provider (PT): Isaac Pina, MD   Encounter Date: 01/27/2020   PT End of Session - 01/27/20 1703    Visit Number 15    Number of Visits 16    Date for PT Re-Evaluation 01/28/20    Authorization Type Progress note every 10th visit    PT Start Time 1645    PT Stop Time 1224    PT Time Calculation (min) 49 min           Past Medical History:  Diagnosis Date   Anxiety    Depression    Generalized headaches    Hemorrhoids    Multiple sclerosis ([place])     Past Surgical History:  Procedure Laterality Date   TOTAL HIP ARTHROPLASTY     Left     There were no vitals filed for this visit.                      [facility] Adult PT Treatment/Exercise - 01/27/20 0001      Knee/Hip Exercises: Stretches   Passive Hamstring Stretch Both;2 reps;60 seconds    Gastroc Stretch Both;2 reps;60 seconds      Knee/Hip Exercises: Aerobic   Elliptical L5,R5 x 6 mins    Nustep L4 x12 min      Knee/Hip Exercises: Machines for Strengthening   Cybex Knee Extension 30# 3x10 reps    Cybex Knee Flexion 40# 3x10 reps      Knee/Hip Exercises: Standing   Rocker Board 3 minutes      Knee/Hip Exercises: Supine   Bridges Strengthening;Both;2 sets;10 reps      Knee/Hip Exercises: Sidelying   Hip ABduction Strengthening;Both;10 reps;3 sets                        PT Long Term Goals - 01/27/20 1704      PT LONG TERM GOAL #1   Title Patient will be independent with HEP    Time 8    Period Weeks    Status Achieved      PT LONG TERM GOAL #2   Title Patient will demonstrate 4/5 or greater LE and UE MMT to improve stability during functional tasks.    Time 8    Period Weeks    Status Achieved      PT LONG TERM GOAL #3   Title Patient will perform 5x sit to stand test in 15 seconds or less to improve functional LE strength and decrease risk of falls.    Time 8    Period Weeks    Status Achieved      PT LONG TERM GOAL #4   Title Patient will improve Berg Balance Scale by 5 points or greater to decrease risk of falls.    Time 8    Period Weeks    Status Achieved  Plan - 01/27/20 1738    Clinical Impression Statement Pt arrived today doing fairly well with energy levels and no knee buckling today. He was able to complete all therex with mainly fatigue, but did have some RT  HS  cramping that was relieved with stretching. All LTG's were met and Pt will be DC to HEP.    Comorbidities Multiple sclerosis, R THA    Examination-Activity Limitations Stand;Stairs;Locomotion Level;Transfers;Carry    Stability/Clinical Decision Making Stable/Uncomplicated    Rehab Potential Good    PT Frequency 2x / week    PT Duration 8 weeks    PT Treatment/Interventions ADLs/Self Care Home Management;Cryotherapy;Electrical Stimulation;Moist Heat;Gait training;Stair training;Functional mobility training;Therapeutic activities;Therapeutic exercise;Balance training;Neuromuscular re-education;Passive range of motion;Patient/family education    PT Next Visit Plan Continue balance progression.    PT Home Exercise Plan see patient education section    Consulted and Agree with Plan of Care Patient           Patient will benefit from skilled therapeutic intervention in order to improve the following deficits and  impairments:  Abnormal gait, Difficulty walking, Decreased activity tolerance, Decreased balance, Decreased strength, Postural dysfunction, Pain  Visit Diagnosis: Unsteadiness on feet  Repeated falls  Difficulty in walking, not elsewhere classified     Problem List Patient Active Problem List   Diagnosis Date Noted   Other headache syndrome 02/24/2019   Dysesthesia 01/05/2019   Other fatigue 01/05/2019   Anxiety 09/21/2018   Shifting sleep-work schedule 09/21/2018   Multiple sclerosis ([place]) 09/02/2018   Pain in left hip 12/04/2017   Chronic right-sided low back pain with right-sided sciatica 11/18/2017   Degenerative disc disease, lumbar 11/18/2017   Idiopathic scoliosis in adult patient 11/18/2017   Depression, recurrent ([place]) 12/13/2016    Isaac Scott,[name], PTA 01/27/2020, 5:46 PM  [facility] [address] [place], [place], [address] Phone: [phone]   Fax:  [phone]  Name: Isaac Scott MRN: [id number] Date of Birth: October 01, 1990

## 2020-03-11 ENCOUNTER — Other Ambulatory Visit: Payer: Self-pay | Admitting: Neurology

## 2020-03-11 DIAGNOSIS — E559 Vitamin D deficiency, unspecified: Secondary | ICD-10-CM

## 2020-03-15 ENCOUNTER — Telehealth: Payer: Self-pay | Admitting: *Deleted

## 2020-03-15 NOTE — Telephone Encounter (Signed)
I called pt Isaac Scott unable to reach pt. 

## 2020-03-16 ENCOUNTER — Encounter: Payer: Self-pay | Admitting: Family Medicine

## 2020-03-27 ENCOUNTER — Telehealth: Payer: Self-pay | Admitting: Neurology

## 2020-03-27 NOTE — Telephone Encounter (Signed)
Isaac Scott has reduced exercise tolerance.  In the past, he could walk 2 miles a day.  He is now unable to walk longer distances.  He is going to be transferring care for his MS to [facility].

## 2020-03-28 ENCOUNTER — Other Ambulatory Visit: Payer: Self-pay | Admitting: Neurology

## 2020-04-13 ENCOUNTER — Encounter: Payer: Self-pay | Admitting: Family Medicine

## 2020-04-13 ENCOUNTER — Telehealth: Payer: Self-pay | Admitting: Family Medicine

## 2020-04-13 NOTE — Telephone Encounter (Signed)
Appointment scheduled.

## 2020-04-14 ENCOUNTER — Encounter: Payer: Self-pay | Admitting: Nurse Practitioner

## 2020-04-14 ENCOUNTER — Ambulatory Visit (INDEPENDENT_AMBULATORY_CARE_PROVIDER_SITE_OTHER): Payer: 59 | Admitting: Nurse Practitioner

## 2020-04-14 ENCOUNTER — Other Ambulatory Visit: Payer: Self-pay

## 2020-04-14 VITALS — BP 116/74 | HR 73 | Temp 97.6°F | Resp 20 | Ht 71.0 in | Wt 264.0 lb

## 2020-04-14 DIAGNOSIS — M940 Chondrocostal junction syndrome [Tietze]: Secondary | ICD-10-CM

## 2020-04-14 MED ORDER — PREDNISONE 20 MG PO TABS: ORAL_TABLET | ORAL | 0 refills | Status: AC

## 2020-04-14 NOTE — Patient Instructions (Signed)
Costochondritis Costochondritis is swelling and irritation (inflammation) of the tissue (cartilage) that connects your ribs to your breastbone (sternum). This causes pain in the front of your chest. Usually, the pain:  Starts gradually.  Is in more than one rib. This condition usually goes away on its own over time. Follow these instructions at home:  Do not do anything that makes your pain worse.  If directed, put ice on the painful area: ? Put ice in a plastic bag. ? Place a towel between your skin and the bag. ? Leave the ice on for 20 minutes, 2-3 times a day.  If directed, put heat on the affected area as often as told by your doctor. Use the heat source that your doctor tells you to use, such as a moist heat pack or a heating pad. ? Place a towel between your skin and the heat source. ? Leave the heat on for 20-30 minutes. ? Take off the heat if your skin turns bright red. This is very important if you cannot feel pain, heat, or cold. You may have a greater risk of getting burned.  Take over-the-counter and prescription medicines only as told by your doctor.  Return to your normal activities as told by your doctor. Ask your doctor what activities are safe for you.  Keep all follow-up visits as told by your doctor. This is important. Contact a doctor if:  You have chills or a fever.  Your pain does not go away or it gets worse.  You have a cough that does not go away. Get help right away if:  You are short of breath. This information is not intended to replace advice given to you by your health care provider. Make sure you discuss any questions you have with your health care provider. Document Revised: 08/06/2017 Document Reviewed: 11/15/2015 Elsevier Patient Education  2020 [employer]  

## 2020-04-14 NOTE — Telephone Encounter (Signed)
Already scheduled with MMM

## 2020-04-14 NOTE — Progress Notes (Signed)
Subjective:    Patient ID: Isaac Scott, male    DOB: 1991-06-26, 29 y.o.   MRN: [id number]   Chief Complaint: Right side chest pain (Seen at Urgent Care yesterday and diagnosed pleurisy. They recommend a CT)   HPI Right upper chest pain that began Sunday. Increased in severity since. Sharp shooting pain that is worse in the AM upon waking. Has not taken any meds for intervention until UC visit yesterday. Prednisolone & Toradol with some relief until meds wore off. Right arm movement with pain. Pain is elicited by turning head to left and right. Nothing makes pain better. Hx of MS. Baclofen without relief. Concerned for PE, requesting DDimer. Not tender to touch. Pt states some SOB stating that his breathing is 'heavier'. 5/10 pain.    Review of Systems  Constitutional: Negative.   HENT: Negative.   Eyes: Negative.   Respiratory: Negative.   Cardiovascular: Negative.   Gastrointestinal: Negative.   Endocrine: Negative.   Genitourinary: Negative.   Musculoskeletal: Positive for neck stiffness.  Skin: Negative.   Allergic/Immunologic: Negative.   Neurological: Negative.   Hematological: Negative.   Psychiatric/Behavioral: Negative.   All other systems reviewed and are negative.     Objective:   Physical Exam Vitals and nursing note reviewed.  Constitutional:      Appearance: Normal appearance.  HENT:     Head: Normocephalic and atraumatic.     Right Ear: External ear normal.     Left Ear: External ear normal.     Nose: Nose normal.     Mouth/Throat:     Mouth: Mucous membranes are moist.     Pharynx: Oropharynx is clear.  Eyes:     Extraocular Movements: Extraocular movements intact.     Conjunctiva/sclera: Conjunctivae normal.  Cardiovascular:     Rate and Rhythm: Normal rate and regular rhythm.     Pulses: Normal pulses.     Heart sounds: Normal heart sounds.  Pulmonary:     Effort: Pulmonary effort is normal.     Breath sounds: Normal breath sounds.      Comments: Chest wall pain on palpation Chest:     Breasts:        Right: Normal.        Left: Normal.  Abdominal:     General: Bowel sounds are normal.  Musculoskeletal:     Cervical back: Normal range of motion.  Skin:    General: Skin is warm and dry.     Capillary Refill: Capillary refill takes less than 2 seconds.  Neurological:     General: No focal deficit present.     Mental Status: He is alert and oriented to person, place, and time. Mental status is at baseline.  Psychiatric:        Mood and Affect: Mood normal.        Behavior: Behavior normal.        Thought Content: Thought content normal.   BP 116/74   Pulse 73   Temp 97.6 F (36.4 C) (Temporal)   Resp 20   Ht 5\' 11"  (1.803 m)   Wt 264 lb (119.7 kg)   SpO2 97%   BMI 36.82 kg/m   Vitals - no change with ambulation- no sob or tachycardia    Assessment & Plan:  Isaac Scott in today with chief complaint of Right side chest pain (Seen at Urgent Care yesterday and diagnosed pleurisy. They recommend a CT)   1. Acute costochondritis Moist heat Rest  medical management of chronic issues  Call if no better or worsens in the next few days    The above assessment and management plan was discussed with the patient. The patient verbalized understanding of and has agreed to the management plan. Patient is aware to call the clinic if symptoms persist or worsen. Patient is aware when to return to the clinic for a follow-up visit. Patient educated on when it is appropriate to go to the emergency department.   Mary-Margaret Daphine Deutscher, FNP

## 2020-06-09 ENCOUNTER — Telehealth: Payer: Self-pay | Admitting: Family Medicine

## 2020-06-09 NOTE — Telephone Encounter (Signed)
Isaac Scott is calling to verify the date that Isaac Scott was first seen for MS, was the pt seen between feb-aug of 2020

## 2020-06-09 NOTE — Telephone Encounter (Signed)
It looks like the first time that we had the symptoms where we sent him for the MRI was August 2019 and then the first time he actually carries the diagnosis of multiple sclerosis on his chart is January 2020

## 2020-06-19 NOTE — Telephone Encounter (Signed)
Insurance company was informed
